# Patient Record
Sex: Female | Born: 1940 | Race: White | Hispanic: No | Marital: Married | Smoking: Never smoker
Health system: Southern US, Community
[De-identification: ages and names within clinical notes are randomized; demographics above are authoritative.]

## PROBLEM LIST (undated history)

## (undated) DIAGNOSIS — Z8619 Personal history of other infectious and parasitic diseases: Secondary | ICD-10-CM

## (undated) DIAGNOSIS — T7840XA Allergy, unspecified, initial encounter: Secondary | ICD-10-CM

## (undated) DIAGNOSIS — R42 Dizziness and giddiness: Secondary | ICD-10-CM

## (undated) DIAGNOSIS — G971 Other reaction to spinal and lumbar puncture: Secondary | ICD-10-CM

## (undated) DIAGNOSIS — N819 Female genital prolapse, unspecified: Secondary | ICD-10-CM

## (undated) DIAGNOSIS — I499 Cardiac arrhythmia, unspecified: Secondary | ICD-10-CM

## (undated) DIAGNOSIS — Q630 Accessory kidney: Secondary | ICD-10-CM

## (undated) DIAGNOSIS — I10 Essential (primary) hypertension: Secondary | ICD-10-CM

## (undated) DIAGNOSIS — S42201A Unspecified fracture of upper end of right humerus, initial encounter for closed fracture: Secondary | ICD-10-CM

## (undated) DIAGNOSIS — M1990 Unspecified osteoarthritis, unspecified site: Secondary | ICD-10-CM

## (undated) DIAGNOSIS — J302 Other seasonal allergic rhinitis: Secondary | ICD-10-CM

## (undated) DIAGNOSIS — E785 Hyperlipidemia, unspecified: Secondary | ICD-10-CM

## (undated) DIAGNOSIS — K219 Gastro-esophageal reflux disease without esophagitis: Secondary | ICD-10-CM

## (undated) DIAGNOSIS — L570 Actinic keratosis: Secondary | ICD-10-CM

## (undated) HISTORY — PX: VEIN SURGERY: SHX48

## (undated) HISTORY — PX: COLONOSCOPY: SHX174

## (undated) HISTORY — PX: DILATION AND CURETTAGE OF UTERUS: SHX78

## (undated) HISTORY — PX: CATARACT EXTRACTION: SUR2

## (undated) HISTORY — DX: Actinic keratosis: L57.0

---

## 2004-05-09 ENCOUNTER — Ambulatory Visit: Payer: Self-pay | Admitting: Obstetrics and Gynecology

## 2005-04-03 ENCOUNTER — Ambulatory Visit: Payer: Self-pay | Admitting: Gastroenterology

## 2005-05-15 ENCOUNTER — Ambulatory Visit: Payer: Self-pay | Admitting: Obstetrics and Gynecology

## 2006-03-12 ENCOUNTER — Ambulatory Visit: Payer: Self-pay | Admitting: Family Medicine

## 2006-05-17 ENCOUNTER — Ambulatory Visit: Payer: Self-pay | Admitting: Obstetrics and Gynecology

## 2007-05-20 ENCOUNTER — Ambulatory Visit: Payer: Self-pay | Admitting: Obstetrics and Gynecology

## 2008-01-11 ENCOUNTER — Ambulatory Visit: Payer: Self-pay | Admitting: Family Medicine

## 2008-05-25 ENCOUNTER — Ambulatory Visit: Payer: Self-pay | Admitting: Obstetrics and Gynecology

## 2008-06-07 ENCOUNTER — Ambulatory Visit: Payer: Self-pay | Admitting: Gastroenterology

## 2009-06-11 ENCOUNTER — Ambulatory Visit: Payer: Self-pay | Admitting: Obstetrics and Gynecology

## 2009-08-01 ENCOUNTER — Ambulatory Visit: Payer: Self-pay | Admitting: Orthopedic Surgery

## 2010-07-08 ENCOUNTER — Ambulatory Visit: Payer: Self-pay | Admitting: Obstetrics and Gynecology

## 2011-07-10 ENCOUNTER — Ambulatory Visit: Payer: Self-pay | Admitting: Obstetrics and Gynecology

## 2012-07-08 ENCOUNTER — Ambulatory Visit: Payer: Self-pay | Admitting: Gastroenterology

## 2012-07-11 LAB — PATHOLOGY REPORT

## 2012-07-14 ENCOUNTER — Ambulatory Visit: Payer: Self-pay | Admitting: Obstetrics and Gynecology

## 2012-07-31 ENCOUNTER — Emergency Department: Payer: Self-pay | Admitting: Emergency Medicine

## 2012-07-31 DIAGNOSIS — S42201A Unspecified fracture of upper end of right humerus, initial encounter for closed fracture: Secondary | ICD-10-CM

## 2012-07-31 HISTORY — DX: Unspecified fracture of upper end of right humerus, initial encounter for closed fracture: S42.201A

## 2013-07-17 ENCOUNTER — Ambulatory Visit: Payer: Self-pay | Admitting: Obstetrics and Gynecology

## 2013-09-18 DIAGNOSIS — N814 Uterovaginal prolapse, unspecified: Secondary | ICD-10-CM | POA: Insufficient documentation

## 2013-09-18 DIAGNOSIS — N3941 Urge incontinence: Secondary | ICD-10-CM | POA: Insufficient documentation

## 2013-09-18 DIAGNOSIS — R3129 Other microscopic hematuria: Secondary | ICD-10-CM | POA: Insufficient documentation

## 2013-09-18 DIAGNOSIS — Q630 Accessory kidney: Secondary | ICD-10-CM | POA: Insufficient documentation

## 2014-07-09 DIAGNOSIS — N819 Female genital prolapse, unspecified: Secondary | ICD-10-CM | POA: Insufficient documentation

## 2014-07-10 DIAGNOSIS — N281 Cyst of kidney, acquired: Secondary | ICD-10-CM | POA: Insufficient documentation

## 2014-07-18 ENCOUNTER — Ambulatory Visit: Payer: Self-pay | Admitting: Obstetrics and Gynecology

## 2014-07-25 ENCOUNTER — Ambulatory Visit: Payer: Self-pay | Admitting: Ophthalmology

## 2014-07-25 DIAGNOSIS — I499 Cardiac arrhythmia, unspecified: Secondary | ICD-10-CM

## 2014-07-30 ENCOUNTER — Ambulatory Visit: Payer: Self-pay | Admitting: Ophthalmology

## 2014-11-06 DIAGNOSIS — N812 Incomplete uterovaginal prolapse: Secondary | ICD-10-CM | POA: Insufficient documentation

## 2014-11-06 DIAGNOSIS — Z4689 Encounter for fitting and adjustment of other specified devices: Secondary | ICD-10-CM | POA: Insufficient documentation

## 2014-11-06 DIAGNOSIS — N8111 Cystocele, midline: Secondary | ICD-10-CM | POA: Insufficient documentation

## 2014-11-18 NOTE — Op Note (Signed)
PATIENT NAME:  [name], Terri MR#:  [id number] DATE OF BIRTH:  [birth year]  DATE OF PROCEDURE:  07/30/2014  PREOPERATIVE DIAGNOSIS:  Nuclear sclerotic cataract, left eye.    POSTOPERATIVE DIAGNOSIS:  Nuclear sclerotic cataract, left eye.    PROCEDURE PERFORMED:  Extracapsular cataract extraction using phacoemulsification with placement of an Alcon [id number], 17.5-diopter posterior chamber lens, serial #[id number].   SURGEON:  Loura Back. Gardy Montanari, MD  ASSISTANT:  None.  ANESTHESIA:  4% lidocaine and 0.75% Marcaine in a 50/50 mixture with 10 units/mL of Hylenex added, given as a peribulbar.   ANESTHESIOLOGIST:  Dr. Andree Elk.  COMPLICATIONS:  None.  ESTIMATED BLOOD LOSS:  Less than 1 ml.  DESCRIPTION OF PROCEDURE:  The patient was brought to the operating room and given a peribulbar block.  The patient was then prepped and draped in the usual fashion.  The vertical rectus muscles were imbricated using 5-0 silk sutures.  These sutures were then clamped to the sterile drapes as bridle sutures.  A limbal peritomy was performed extending two clock hours and hemostasis was obtained with cautery.  A partial thickness scleral groove was made at the surgical limbus and dissected anteriorly in a lamellar dissection using an Alcon crescent knife.  The anterior chamber was entered supero-temporally with a Superblade and through the lamellar dissection with a 2.6 mm keratome.  DisCoVisc was used to replace the aqueous and a continuous tear capsulorrhexis was carried out.  Hydrodissection and hydrodelineation were carried out with balanced salt and a 27 gauge canula.  The nucleus was rotated to confirm the effectiveness of the hydrodissection.  Phacoemulsification was carried out using a divide-and-conquer technique.  Total ultrasound time was 1 minute and 1 second with an average power of 21.7 percent and CDE of 24.36.  Irrigation/aspiration was used to remove the residual cortex.  DisCoVisc was used to  inflate the capsule and the internal incision was enlarged to 3 mm with the crescent knife.  The intraocular lens was folded and inserted into the capsular bag using the AcrySert delivery system. Irrigation/aspiration was used to remove the residual DisCoVisc.  Miostat was injected into the anterior chamber through the paracentesis track to inflate the anterior chamber and induce miosis. A tenth of a milliliter of cefuroxime containing 1 mg of drug was injected via the paracentesis tract. The wound was checked for leaks and none were found. The conjunctiva was closed with cautery and the bridle sutures were removed.  Two drops of 0.3% Vigamox were placed on the eye.   An eye shield was placed on the eye.  The patient was discharged to the recovery room in good condition.  ____________________________ Loura Back Amillia Biffle, MD sad:sb D: 07/30/2014 13:28:05 ET T: 07/30/2014 14:16:57 ET JOB#: [id number]  cc: Remo Lipps A. Calin Fantroy, MD, <Dictator> Martie Lee MD ELECTRONICALLY SIGNED 08/01/2014 16:21

## 2015-02-26 DIAGNOSIS — R200 Anesthesia of skin: Secondary | ICD-10-CM | POA: Insufficient documentation

## 2015-06-04 ENCOUNTER — Other Ambulatory Visit: Payer: Self-pay | Admitting: Obstetrics and Gynecology

## 2015-06-04 DIAGNOSIS — Z1231 Encounter for screening mammogram for malignant neoplasm of breast: Secondary | ICD-10-CM

## 2015-07-23 ENCOUNTER — Ambulatory Visit
Admission: RE | Admit: 2015-07-23 | Discharge: 2015-07-23 | Disposition: A | Discharge status: Home / Self Care | Payer: Medicare Other | Source: Ambulatory Visit | Attending: Obstetrics and Gynecology | Admitting: Obstetrics and Gynecology

## 2015-07-23 ENCOUNTER — Other Ambulatory Visit: Payer: Self-pay | Admitting: Obstetrics and Gynecology

## 2015-07-23 DIAGNOSIS — Z1231 Encounter for screening mammogram for malignant neoplasm of breast: Secondary | ICD-10-CM | POA: Diagnosis not present

## 2016-06-08 ENCOUNTER — Other Ambulatory Visit: Payer: Self-pay | Admitting: Obstetrics and Gynecology

## 2016-06-08 DIAGNOSIS — Z1231 Encounter for screening mammogram for malignant neoplasm of breast: Secondary | ICD-10-CM

## 2016-07-29 ENCOUNTER — Ambulatory Visit
Admission: RE | Admit: 2016-07-29 | Discharge: 2016-07-29 | Disposition: A | Discharge status: Home / Self Care | Payer: Medicare Other | Source: Ambulatory Visit | Attending: Obstetrics and Gynecology | Admitting: Obstetrics and Gynecology

## 2016-07-29 ENCOUNTER — Other Ambulatory Visit: Payer: Self-pay | Admitting: Obstetrics and Gynecology

## 2016-07-29 DIAGNOSIS — Z1231 Encounter for screening mammogram for malignant neoplasm of breast: Secondary | ICD-10-CM | POA: Diagnosis not present

## 2017-06-24 ENCOUNTER — Other Ambulatory Visit: Payer: Self-pay | Admitting: Obstetrics and Gynecology

## 2017-06-24 DIAGNOSIS — Z1231 Encounter for screening mammogram for malignant neoplasm of breast: Secondary | ICD-10-CM

## 2017-08-11 ENCOUNTER — Ambulatory Visit
Admission: RE | Admit: 2017-08-11 | Discharge: 2017-08-11 | Disposition: A | Discharge status: Home / Self Care | Payer: Medicare HMO | Source: Ambulatory Visit | Attending: Obstetrics and Gynecology | Admitting: Obstetrics and Gynecology

## 2017-08-11 DIAGNOSIS — Z1231 Encounter for screening mammogram for malignant neoplasm of breast: Secondary | ICD-10-CM | POA: Insufficient documentation

## 2017-09-16 ENCOUNTER — Encounter: Payer: Self-pay | Admitting: *Deleted

## 2017-09-17 ENCOUNTER — Ambulatory Visit
Admission: RE | Admit: 2017-09-17 | Discharge: 2017-09-17 | Disposition: A | Discharge status: Home / Self Care | Payer: Medicare HMO | Source: Ambulatory Visit | Attending: Gastroenterology | Admitting: Gastroenterology

## 2017-09-17 ENCOUNTER — Encounter: Payer: Self-pay | Admitting: *Deleted

## 2017-09-17 ENCOUNTER — Encounter
Admission: RE | Disposition: A | Discharge status: Home / Self Care | Payer: Self-pay | Source: Ambulatory Visit | Attending: Gastroenterology

## 2017-09-17 ENCOUNTER — Ambulatory Visit: Payer: Medicare HMO | Admitting: Anesthesiology

## 2017-09-17 DIAGNOSIS — Z8619 Personal history of other infectious and parasitic diseases: Secondary | ICD-10-CM | POA: Insufficient documentation

## 2017-09-17 DIAGNOSIS — D123 Benign neoplasm of transverse colon: Secondary | ICD-10-CM | POA: Insufficient documentation

## 2017-09-17 DIAGNOSIS — Z800 Family history of malignant neoplasm of digestive organs: Secondary | ICD-10-CM | POA: Diagnosis not present

## 2017-09-17 DIAGNOSIS — Z7982 Long term (current) use of aspirin: Secondary | ICD-10-CM | POA: Insufficient documentation

## 2017-09-17 DIAGNOSIS — K5730 Diverticulosis of large intestine without perforation or abscess without bleeding: Secondary | ICD-10-CM | POA: Insufficient documentation

## 2017-09-17 DIAGNOSIS — Z882 Allergy status to sulfonamides status: Secondary | ICD-10-CM | POA: Diagnosis not present

## 2017-09-17 DIAGNOSIS — E785 Hyperlipidemia, unspecified: Secondary | ICD-10-CM | POA: Insufficient documentation

## 2017-09-17 DIAGNOSIS — Z86010 Personal history of colonic polyps: Secondary | ICD-10-CM | POA: Diagnosis not present

## 2017-09-17 DIAGNOSIS — Z79899 Other long term (current) drug therapy: Secondary | ICD-10-CM | POA: Diagnosis not present

## 2017-09-17 DIAGNOSIS — D122 Benign neoplasm of ascending colon: Secondary | ICD-10-CM | POA: Insufficient documentation

## 2017-09-17 DIAGNOSIS — M1990 Unspecified osteoarthritis, unspecified site: Secondary | ICD-10-CM | POA: Insufficient documentation

## 2017-09-17 DIAGNOSIS — D120 Benign neoplasm of cecum: Secondary | ICD-10-CM | POA: Diagnosis not present

## 2017-09-17 HISTORY — DX: Accessory kidney: Q63.0

## 2017-09-17 HISTORY — DX: Personal history of other infectious and parasitic diseases: Z86.19

## 2017-09-17 HISTORY — DX: Hyperlipidemia, unspecified: E78.5

## 2017-09-17 HISTORY — PX: COLONOSCOPY WITH PROPOFOL: SHX5780

## 2017-09-17 HISTORY — DX: Female genital prolapse, unspecified: N81.9

## 2017-09-17 HISTORY — DX: Allergy, unspecified, initial encounter: T78.40XA

## 2017-09-17 HISTORY — DX: Unspecified fracture of upper end of right humerus, initial encounter for closed fracture: S42.201A

## 2017-09-17 HISTORY — DX: Gastro-esophageal reflux disease without esophagitis: K21.9

## 2017-09-17 HISTORY — DX: Other seasonal allergic rhinitis: J30.2

## 2017-09-17 HISTORY — DX: Unspecified osteoarthritis, unspecified site: M19.90

## 2017-09-17 SURGERY — COLONOSCOPY WITH PROPOFOL
Anesthesia: General

## 2017-09-17 MED ORDER — SODIUM CHLORIDE 0.9 % IV SOLN
INTRAVENOUS | Status: DC
  Administered 2017-09-17: 14:00:00 via INTRAVENOUS

## 2017-09-17 MED ORDER — MIDAZOLAM HCL 2 MG/2ML IJ SOLN
INTRAMUSCULAR | Status: DC | PRN
  Administered 2017-09-17: .5 mg via INTRAVENOUS

## 2017-09-17 MED ORDER — FENTANYL CITRATE (PF) 100 MCG/2ML IJ SOLN
INTRAMUSCULAR | Status: DC | PRN
  Administered 2017-09-17: 25 ug via INTRAVENOUS

## 2017-09-17 MED ORDER — FENTANYL CITRATE (PF) 100 MCG/2ML IJ SOLN
INTRAMUSCULAR | Status: AC
  Filled 2017-09-17: qty 2

## 2017-09-17 MED ORDER — PROPOFOL 500 MG/50ML IV EMUL
INTRAVENOUS | Status: DC | PRN
  Administered 2017-09-17: 140 ug/kg/min via INTRAVENOUS

## 2017-09-17 MED ORDER — PROPOFOL 10 MG/ML IV BOLUS
INTRAVENOUS | Status: DC | PRN
  Administered 2017-09-17: 40 mg via INTRAVENOUS

## 2017-09-17 MED ORDER — MIDAZOLAM HCL 2 MG/2ML IJ SOLN
INTRAMUSCULAR | Status: AC
  Filled 2017-09-17: qty 2

## 2017-09-17 MED ORDER — PROPOFOL 500 MG/50ML IV EMUL
INTRAVENOUS | Status: AC
  Filled 2017-09-17: qty 50

## 2017-09-17 MED ORDER — EPHEDRINE SULFATE 50 MG/ML IJ SOLN
INTRAMUSCULAR | Status: DC | PRN
  Administered 2017-09-17: 15 mg via INTRAVENOUS

## 2017-09-17 MED ORDER — LIDOCAINE HCL (PF) 2 % IJ SOLN
INTRAMUSCULAR | Status: AC
  Filled 2017-09-17: qty 10

## 2017-09-17 NOTE — Transfer of Care (Signed)
Immediate Anesthesia Transfer of Care Note  Patient: Terri [name]  Procedure(s) Performed: COLONOSCOPY WITH PROPOFOL (N/A )  Patient Location: PACU  Anesthesia Type:General  Level of Consciousness: awake  Airway & Oxygen Therapy: Patient Spontanous Breathing and Patient connected to nasal cannula oxygen  Post-op Assessment: Report given to RN and Post -op Vital signs reviewed and stable  Post vital signs: Reviewed and stable  Last Vitals:  Vitals:   09/17/17 1317 09/17/17 1447  BP: (!) 156/67 (!) 111/50  Pulse: 77   Resp: 16   Temp: (!) 36.3 C (!) 36.1 C  SpO2: 100%     Last Pain:  Vitals:   09/17/17 1447  TempSrc: Tympanic         Complications: No apparent anesthesia complications

## 2017-09-17 NOTE — H&P (Signed)
Outpatient short stay form Pre-procedure 09/17/2017 1:43 PM Lollie Sails MD  Primary Physician: Dr. [place] Pink  Reason for visit: Colonoscopy  History of present illness: Patient is a 77 year old female presenting today as above.  She has personal history of adenomatous colon polyps.  There is a family history of colon cancer primary relative, daughter.  She tolerated prep well.  She does take a 81 mg aspirin that is been held for a couple of days.  She takes no other aspirin products or blood thinning agents.    Current Facility-Administered Medications:  .  0.9 %  sodium chloride infusion, , Intravenous, Continuous, Lollie Sails, MD, Last Rate: 20 mL/hr at 09/17/17 1338 .  0.9 %  sodium chloride infusion, , Intravenous, Continuous, Lollie Sails, MD  Medications Prior to Admission  Medication Sig Dispense Refill Last Dose  . aspirin EC 81 MG tablet Take 81 mg by mouth daily.   Past Week at Unknown time  . ergocalciferol (VITAMIN D2) 50000 units capsule Take 50,000 Units by mouth once a week.   Past Week at Unknown time  . meloxicam (MOBIC) 7.5 MG tablet Take 7.5 mg by mouth daily as needed for pain.   Past Month at Unknown time  . simvastatin (ZOCOR) 20 MG tablet Take 20 mg by mouth daily.   Past Week at Unknown time  . triamcinolone cream (KENALOG) 0.1 % Apply 1 application topically 2 (two) times daily as needed.     . vitamin B-12 (CYANOCOBALAMIN) 1000 MCG tablet Take 1,000 mcg by mouth daily.   Past Week at Unknown time  . estradiol (ESTRACE) 0.1 MG/GM vaginal cream Place 1 Applicatorful vaginally 2 (two) times a week.   Not Taking at Unknown time     Allergies  Allergen Reactions  . Sulfa Antibiotics      Past Medical History:  Diagnosis Date  . Allergic state   . Arthritis   . Closed fracture of right proximal humerus 07/31/2012  . GERD (gastroesophageal reflux disease)   . History of shingles   . Hyperlipidemia   . Prolapse of female pelvic organs    . Renal duplication   . Seasonal allergies     Review of systems:      Physical Exam    Heart and lungs: Regular rate and rhythm without rub or gallop, lungs are bilaterally clear.    HEENT: Normocephalic atraumatic eyes are anicteric    Other:    Pertinant exam for procedure: Soft nontender nondistended bowel sounds positive normoactive    Planned proceedures: Colonoscopy and indicated procedures. I have discussed the risks benefits and complications of procedures to include not limited to bleeding, infection, perforation and the risk of sedation and the patient wishes to proceed.    Lollie Sails, MD Gastroenterology 09/17/2017  1:43 PM

## 2017-09-17 NOTE — Op Note (Signed)
[facility] Gastroenterology Patient Name: Terri [name] Procedure Date: 09/17/2017 1:42 PM MRN: [id number] Account #: [id number] Date of Birth: 1941-06-12 Admit Type: Outpatient Age: 77 Room: [facility] ENDO ROOM 3 Gender: Female Note Status: Finalized Procedure:            Colonoscopy Indications:          Family history of colon cancer in a first-degree                        relative, Personal history of colonic polyps Providers:            Lollie Sails, MD Referring MD:         No Local Md, MD (Referring MD) Medicines:            Monitored Anesthesia Care Complications:        No immediate complications. Procedure:            Pre-Anesthesia Assessment:                       - ASA Grade Assessment: II - A patient with mild                        systemic disease.                       After obtaining informed consent, the colonoscope was                        passed under direct vision. Throughout the procedure,                        the patient's blood pressure, pulse, and oxygen                        saturations were monitored continuously. The                        Colonoscope was introduced through the anus and                        advanced to the the cecum, identified by appendiceal                        orifice and ileocecal valve. The colonoscopy was                        unusually difficult due to a tortuous colon. Successful                        completion of the procedure was aided by changing the                        patient to a supine position, changing the patient to a                        prone position, using manual pressure and withdrawing                        and reinserting the scope. The quality of the bowel  preparation was good. Findings:      A 2 mm polyp was found in the cecum. The polyp was sessile. The polyp       was removed with a cold biopsy forceps. Resection and retrieval were   complete.      A 2 mm polyp was found in the proximal ascending colon. The polyp was       sessile. The polyp was removed with a cold biopsy forceps. Resection and       retrieval were complete.      A 8 mm polyp was found in the hepatic flexure. The polyp was sessile.       The polyp was removed with a piecemeal technique using a cold biopsy       forceps. Resection and retrieval were complete.      Multiple small-mouthed diverticula were found in the sigmoid colon and       descending colon.      The digital rectal exam was normal. Impression:           - One 2 mm polyp in the cecum, removed with a cold                        biopsy forceps. Resected and retrieved.                       - One 2 mm polyp in the proximal ascending colon,                        removed with a cold biopsy forceps. Resected and                        retrieved.                       - One 8 mm polyp at the hepatic flexure, removed                        piecemeal using a cold biopsy forceps. Resected and                        retrieved.                       - Diverticulosis in the sigmoid colon and in the                        descending colon. Recommendation:       - Discharge patient to home.                       - Await pathology results.                       - Telephone GI clinic for pathology results in 1 week. Procedure Code(s):    --- Professional ---                       [phone], Colonoscopy, flexible; with biopsy, single or                        multiple Diagnosis Code(s):    --- Professional ---  D12.0, Benign neoplasm of cecum                       D12.2, Benign neoplasm of ascending colon                       D12.3, Benign neoplasm of transverse colon (hepatic                        flexure or splenic flexure)                       Z80.0, Family history of malignant neoplasm of                        digestive organs                       Z86.010, Personal history  of colonic polyps                       K57.30, Diverticulosis of large intestine without                        perforation or abscess without bleeding CPT copyright 2016 [employer]. All rights reserved. The codes documented in this report are preliminary and upon coder review may  be revised to meet current compliance requirements. Lollie Sails, MD 09/17/2017 2:45:07 PM This report has been signed electronically. Number of Addenda: 0 Note Initiated On: 09/17/2017 1:42 PM Scope Withdrawal Time: 0 hours 15 minutes 38 seconds  Total Procedure Duration: 0 hours 48 minutes 45 seconds       [facility]

## 2017-09-17 NOTE — Anesthesia Procedure Notes (Signed)
Date/Time: 09/17/2017 1:45 PM Performed by: Allean Found, CRNA Pre-anesthesia Checklist: Patient identified, Emergency Drugs available, Suction available, Patient being monitored and Timeout performed Patient Re-evaluated:Patient Re-evaluated prior to induction Oxygen Delivery Method: Nasal cannula Placement Confirmation: positive ETCO2

## 2017-09-17 NOTE — Anesthesia Post-op Follow-up Note (Signed)
Anesthesia QCDR form completed.        

## 2017-09-18 ENCOUNTER — Encounter: Payer: Self-pay | Admitting: Gastroenterology

## 2017-09-20 NOTE — Anesthesia Postprocedure Evaluation (Signed)
Anesthesia Post Note  Patient: Terri [name]  Procedure(s) Performed: COLONOSCOPY WITH PROPOFOL (N/A )  Patient location during evaluation: PACU Anesthesia Type: General Level of consciousness: awake and alert Pain management: pain level controlled Vital Signs Assessment: post-procedure vital signs reviewed and stable Respiratory status: spontaneous breathing, nonlabored ventilation, respiratory function stable and patient connected to nasal cannula oxygen Cardiovascular status: blood pressure returned to baseline and stable Postop Assessment: no apparent nausea or vomiting Anesthetic complications: no     Last Vitals:  Vitals:   09/17/17 1447 09/17/17 1507  BP: (!) 111/50 (!) 125/56  Pulse:    Resp:    Temp: (!) 36.1 C   SpO2:      Last Pain:  Vitals:   09/17/17 1447  TempSrc: Tympanic                 Molli Barrows

## 2017-09-20 NOTE — Anesthesia Preprocedure Evaluation (Signed)
Anesthesia Evaluation  Patient identified by MRN, date of birth, ID band Patient awake    Reviewed: Allergy & Precautions, H&P , NPO status , Patient's Chart, lab work & pertinent test results, reviewed documented beta blocker date and time   Airway Mallampati: II   Neck ROM: full    Dental  (+) Poor Dentition   Pulmonary neg pulmonary ROS,    Pulmonary exam normal        Cardiovascular Exercise Tolerance: Good negative cardio ROS Normal cardiovascular exam Rhythm:regular Rate:Normal     Neuro/Psych negative neurological ROS  negative psych ROS   GI/Hepatic negative GI ROS, Neg liver ROS, GERD  Medicated,  Endo/Other  negative endocrine ROS  Renal/GU Renal diseasenegative Renal ROS  negative genitourinary   Musculoskeletal   Abdominal   Peds  Hematology negative hematology ROS (+)   Anesthesia Other Findings Past Medical History: No date: Allergic state No date: Arthritis 07/31/2012: Closed fracture of right proximal humerus No date: GERD (gastroesophageal reflux disease) No date: History of shingles No date: Hyperlipidemia 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 No date: Seasonal allergies Past Surgical History: No date: CATARACT EXTRACTION No date: COLONOSCOPY 09/17/2017: COLONOSCOPY WITH PROPOFOL; N/A     Comment:  Procedure: COLONOSCOPY WITH PROPOFOL;  Surgeon:               Lollie Sails, MD;  Location: [facility] ENDOSCOPY;                Service: Endoscopy;  Laterality: N/A; No date: DILATION AND CURETTAGE OF UTERUS No date: VEIN SURGERY BMI    Body Mass Index:  22.14 kg/m     Reproductive/Obstetrics negative OB ROS                             Anesthesia Physical Anesthesia Plan  ASA: II  Anesthesia Plan: General   Post-op Pain Management:    Induction:   PONV Risk Score and Plan:   Airway Management Planned:   Additional  Equipment:   Intra-op Plan:   Post-operative Plan:   Informed Consent: I have reviewed the patients History and Physical, chart, labs and discussed the procedure including the risks, benefits and alternatives for the proposed anesthesia with the patient or authorized representative who has indicated his/her understanding and acceptance.   Dental Advisory Given  Plan Discussed with: CRNA  Anesthesia Plan Comments:         Anesthesia Quick Evaluation

## 2017-09-21 LAB — SURGICAL PATHOLOGY

## 2018-06-28 ENCOUNTER — Other Ambulatory Visit: Payer: Self-pay | Admitting: Obstetrics and Gynecology

## 2018-06-28 DIAGNOSIS — Z1231 Encounter for screening mammogram for malignant neoplasm of breast: Secondary | ICD-10-CM

## 2018-08-12 ENCOUNTER — Ambulatory Visit
Admission: RE | Admit: 2018-08-12 | Discharge: 2018-08-12 | Disposition: A | Discharge status: Home / Self Care | Payer: Medicare HMO | Source: Ambulatory Visit | Attending: Obstetrics and Gynecology | Admitting: Obstetrics and Gynecology

## 2018-08-12 DIAGNOSIS — Z1231 Encounter for screening mammogram for malignant neoplasm of breast: Secondary | ICD-10-CM | POA: Diagnosis not present

## 2018-12-14 ENCOUNTER — Emergency Department: Payer: Medicare HMO

## 2018-12-14 ENCOUNTER — Other Ambulatory Visit: Payer: Self-pay

## 2018-12-14 ENCOUNTER — Emergency Department
Admission: EM | Admit: 2018-12-14 | Discharge: 2018-12-14 | Disposition: A | Discharge status: Home / Self Care | Payer: Medicare HMO | Attending: Emergency Medicine | Admitting: Emergency Medicine

## 2018-12-14 DIAGNOSIS — R42 Dizziness and giddiness: Secondary | ICD-10-CM | POA: Diagnosis present

## 2018-12-14 DIAGNOSIS — H81392 Other peripheral vertigo, left ear: Secondary | ICD-10-CM | POA: Insufficient documentation

## 2018-12-14 DIAGNOSIS — I10 Essential (primary) hypertension: Secondary | ICD-10-CM | POA: Diagnosis not present

## 2018-12-14 DIAGNOSIS — Z79899 Other long term (current) drug therapy: Secondary | ICD-10-CM | POA: Diagnosis not present

## 2018-12-14 DIAGNOSIS — R110 Nausea: Secondary | ICD-10-CM | POA: Insufficient documentation

## 2018-12-14 DIAGNOSIS — Z7982 Long term (current) use of aspirin: Secondary | ICD-10-CM | POA: Insufficient documentation

## 2018-12-14 DIAGNOSIS — R51 Headache: Secondary | ICD-10-CM | POA: Diagnosis not present

## 2018-12-14 HISTORY — DX: Essential (primary) hypertension: I10

## 2018-12-14 LAB — BASIC METABOLIC PANEL
Anion gap: 8 (ref 5–15)
BUN: 14 mg/dL (ref 8–23)
CO2: 26 mmol/L (ref 22–32)
Calcium: 8.7 mg/dL — ABNORMAL LOW (ref 8.9–10.3)
Chloride: 105 mmol/L (ref 98–111)
Creatinine, Ser: 0.58 mg/dL (ref 0.44–1.00)
GFR calc Af Amer: >60 mL/min (ref >60)
GFR calc non Af Amer: >60 mL/min (ref >60)
Glucose, Bld: 141 mg/dL — ABNORMAL HIGH (ref 70–99)
Potassium: 3.8 mmol/L (ref 3.5–5.1)
Sodium: 139 mmol/L (ref 135–145)

## 2018-12-14 LAB — URINALYSIS, COMPLETE (UACMP) WITH MICROSCOPIC
Bacteria, UA: NONE SEEN
Bilirubin Urine: NEGATIVE
Glucose, UA: NEGATIVE mg/dL
Hgb urine dipstick: NEGATIVE
Ketones, ur: NEGATIVE mg/dL
Nitrite: NEGATIVE
Protein, ur: NEGATIVE mg/dL
Specific Gravity, Urine: 1.008 (ref 1.005–1.030)
pH: 8 (ref 5.0–8.0)

## 2018-12-14 LAB — ETHANOL: Alcohol, Ethyl (B): <10 mg/dL (ref <10)

## 2018-12-14 LAB — CBC
HCT: 35.8 % — ABNORMAL LOW (ref 36.0–46.0)
Hemoglobin: 12 g/dL (ref 12.0–15.0)
MCH: 30.6 pg (ref 26.0–34.0)
MCHC: 33.5 g/dL (ref 30.0–36.0)
MCV: 91.3 fL (ref 80.0–100.0)
Platelets: 259 10*3/uL (ref 150–400)
RBC: 3.92 MIL/uL (ref 3.87–5.11)
RDW: 12.5 % (ref 11.5–15.5)
WBC: 5.1 10*3/uL (ref 4.0–10.5)
nRBC: 0 % (ref 0.0–0.2)

## 2018-12-14 LAB — ACETAMINOPHEN LEVEL: Acetaminophen (Tylenol), Serum: <10 ug/mL — ABNORMAL LOW (ref 10–30)

## 2018-12-14 LAB — TROPONIN I: Troponin I: <0.03 ng/mL (ref <0.03)

## 2018-12-14 LAB — SALICYLATE LEVEL: Salicylate Lvl: <7 mg/dL (ref 2.8–30.0)

## 2018-12-14 MED ORDER — ONDANSETRON HCL 4 MG/2ML IJ SOLN
4.0000 mg | Freq: Once | INTRAMUSCULAR | Status: DC
  Filled 2018-12-14: qty 2

## 2018-12-14 MED ORDER — SODIUM CHLORIDE 0.9% FLUSH: 3.0000 mL | Freq: Once | INTRAVENOUS | Status: DC

## 2018-12-14 MED ORDER — ONDANSETRON 4 MG PO TBDP
4.0000 mg | ORAL_TABLET | Freq: Once | ORAL | Status: AC
  Administered 2018-12-14: 14:00:00 4 mg via ORAL

## 2018-12-14 MED ORDER — SCOPOLAMINE 1 MG/3DAYS TD PT72
1.0000 | MEDICATED_PATCH | TRANSDERMAL | Status: DC
  Administered 2018-12-14: 1.5 mg via TRANSDERMAL
  Filled 2018-12-14: qty 1

## 2018-12-14 MED ORDER — ONDANSETRON 4 MG PO TBDP
ORAL_TABLET | ORAL | Status: AC
  Administered 2018-12-14: 4 mg via ORAL
  Filled 2018-12-14: qty 1

## 2018-12-14 MED ORDER — MECLIZINE HCL 25 MG PO TABS
25.0000 mg | ORAL_TABLET | Freq: Once | ORAL | Status: AC
  Administered 2018-12-14: 25 mg via ORAL
  Filled 2018-12-14: qty 1

## 2018-12-14 MED ORDER — ONDANSETRON 4 MG PO TBDP: 4.0000 mg | ORAL_TABLET | Freq: Three times a day (TID) | ORAL | 0 refills | Status: DC | PRN

## 2018-12-14 MED ORDER — SODIUM CHLORIDE 0.9 % IV BOLUS
1000.0000 mL | Freq: Once | INTRAVENOUS | Status: AC
  Administered 2018-12-14: 1000 mL via INTRAVENOUS

## 2018-12-14 MED ORDER — MECLIZINE HCL 25 MG PO TABS: 25.0000 mg | ORAL_TABLET | Freq: Three times a day (TID) | ORAL | 1 refills | Status: DC | PRN

## 2018-12-14 NOTE — ED Notes (Signed)
Walked to bsc with one assist, moved slowly, was a little dizzy however she states the meclizine has helped. Alert and oriented, no new complaints. Call light in reach.

## 2018-12-14 NOTE — ED Notes (Signed)
CBG not collected upon arrival. 141 with blood work from lab.

## 2018-12-14 NOTE — Discharge Instructions (Addendum)
Your lab tests, CT scan of the head, and MRI of the brain were all okay today.   We placed a scopolamine patch behind your ear in the ED to help with the symptoms.  This can take a few hours to kick in.  You should remove the patch as soon as you are feeling better, because it can cause side effects including confusion, flushing, dry mouth, and inability to urinate.

## 2018-12-14 NOTE — ED Provider Notes (Signed)
[facility] Emergency Department Provider Note  ____________________________________________  Time seen: Approximately 10:02 AM  I have reviewed the triage vital signs and the nursing notes.   HISTORY  Chief Complaint Dizziness    HPI Terri [name] is a 78 y.o. female with a history of hypertension hyperlipidemia and GERD who complains of dizziness first noticed on waking up this morning  at 6:00 AM.  She reports being normal at bedtime last night.  Dizziness is described as room swaying.  Associated with nausea and a "funny feeling in my head" that she says is not painful and is not a headache.  Denies any pain anywhere.  No chest pain shortness of breath palpitations or syncope.  Dizziness is worse with change in position and turning her head.  She had a few episodes of this over the past 2 weeks that she did not seek care for and resolve spontaneously within an hour or so.  Today's episode feels more severe and is longer lasting so she comes for evaluation.  She denies any paresthesias or motor weakness.  No vision changes.  No recent illness or sick contacts.  Eating and drinking normally.  She reports that when she stands up she feels unsteady on her feet.  Past Medical History:  Diagnosis Date  . Allergic state   . Arthritis   . Closed fracture of right proximal humerus 07/31/2012  . GERD (gastroesophageal reflux disease)   . History of shingles   . Hyperlipidemia   . Hypertension   . Prolapse of female pelvic organs   . Renal duplication   . Seasonal allergies      There are no active problems to display for this patient.    Past Surgical History:  Procedure Laterality Date  . CATARACT EXTRACTION    . COLONOSCOPY    . COLONOSCOPY WITH PROPOFOL N/A 09/17/2017   Procedure: COLONOSCOPY WITH PROPOFOL;  Surgeon: Lollie Sails, MD;  Location: [facility] ENDOSCOPY;  Service: Endoscopy;  Laterality: N/A;  . DILATION AND CURETTAGE OF UTERUS    . VEIN  SURGERY       Prior to Admission medications   Medication Sig Start Date End Date Taking? Authorizing Provider  amLODipine (NORVASC) 5 MG tablet Take 5 mg by mouth every evening.  09/29/18  Yes [provider]  aspirin EC 81 MG tablet Take 81 mg by mouth every evening.    Yes [provider]  ergocalciferol (VITAMIN D2) 50000 units capsule Take 50,000 Units by mouth every Sunday.    Yes [provider]  meloxicam (MOBIC) 7.5 MG tablet Take 7.5 mg by mouth daily as needed for pain.   Yes [provider]  simvastatin (ZOCOR) 20 MG tablet Take 20 mg by mouth every evening.    Yes [provider]  triamcinolone cream (KENALOG) 0.1 % Apply 1 application topically 2 (two) times daily as needed.   Yes [provider]  vitamin B-12 (CYANOCOBALAMIN) 1000 MCG tablet Take 1,000 mcg by mouth every evening.    Yes [provider]  meclizine (ANTIVERT) 25 MG tablet Take 1 tablet (25 mg total) by mouth 3 (three) times daily as needed for dizziness or nausea. 12/14/18   Carrie Mew, MD  ondansetron (ZOFRAN ODT) 4 MG disintegrating tablet Take 1 tablet (4 mg total) by mouth every 8 (eight) hours as needed for nausea or vomiting. 12/14/18   Carrie Mew, MD     Allergies Sulfa antibiotics   Family History  Problem Relation  Age of Onset  . Pancreatic cancer Mother   . Breast cancer Mother   . Hypertension Father   . Diabetes Father   . Prostate cancer Father   . Hypertension Sister   . Crohn's disease Sister   . Hypertension Brother   . Crohn's disease Brother     Social History Social History   Tobacco Use  . Smoking status: Never Smoker  . Smokeless tobacco: Never Used  Substance Use Topics  . Alcohol use: No    Frequency: Never  . Drug use: No    Review of Systems  Constitutional:   No fever or chills.  ENT:   No sore throat. No rhinorrhea. Cardiovascular:   No chest pain or syncope. Respiratory:   No dyspnea  or cough. Gastrointestinal:   Negative for abdominal pain, vomiting and diarrhea.  Musculoskeletal:   Negative for focal pain or swelling All other systems reviewed and are negative except as documented above in ROS and HPI.  ____________________________________________   PHYSICAL EXAM:  VITAL SIGNS: ED Triage Vitals  Enc Vitals Group     BP 12/14/18 0706 (!) 133/57     Pulse Rate 12/14/18 0706 80     Resp 12/14/18 0706 16     Temp 12/14/18 0706 97.6 F (36.4 C)     Temp Source 12/14/18 0706 Oral     SpO2 12/14/18 0706 99 %     Weight 12/14/18 0707 125 lb (56.7 kg)     Height 12/14/18 0707 5\' 5"  (1.651 m)     Head Circumference --      Peak Flow --      Pain Score 12/14/18 0706 9     Pain Loc --      Pain Edu? --      Excl. in [place]? --     Vital signs reviewed, nursing assessments reviewed.   Constitutional:   Alert and oriented. Non-toxic appearance. Eyes:   Conjunctivae are normal. EOMI. PERRL.  No nystagmus ENT      Head:   Normocephalic and atraumatic.      Nose:   No congestion/rhinnorhea.       Mouth/Throat:   MMM, no pharyngeal erythema. No peritonsillar mass.       Neck:   No meningismus. Full ROM. Hematological/Lymphatic/Immunilogical:   No cervical lymphadenopathy. Cardiovascular:   RRR. Symmetric bilateral radial and DP pulses.  No murmurs. Cap refill less than 2 seconds. Respiratory:   Normal respiratory effort without tachypnea/retractions. Breath sounds are clear and equal bilaterally. No wheezes/rales/rhonchi. Gastrointestinal:   Soft and nontender. Non distended. There is no CVA tenderness.  No rebound, rigidity, or guarding.  Musculoskeletal:   Normal range of motion in all extremities. No joint effusions.  No lower extremity tenderness.  No edema. Neurologic:   Normal speech and language.  Cranial nerves III through XII intact Motor grossly intact. No pronator drift, normal finger-to-nose.  Cerebellar function intact No acute focal neurologic  deficits are appreciated.  Skin:    Skin is warm, dry and intact. No rash noted.  No petechiae, purpura, or bullae.  ____________________________________________    LABS (pertinent positives/negatives) (all labs ordered are listed, but only abnormal results are displayed) Labs Reviewed  BASIC METABOLIC PANEL - Abnormal; Notable for the following components:      Result Value   Glucose, Bld 141 (*)    Calcium 8.7 (*)    All other components within normal limits  CBC - Abnormal; Notable for the following components:  HCT 35.8 (*)    All other components within normal limits  URINALYSIS, COMPLETE (UACMP) WITH MICROSCOPIC - Abnormal; Notable for the following components:   Color, Urine YELLOW (*)    APPearance CLOUDY (*)    Leukocytes,Ua TRACE (*)    All other components within normal limits  ACETAMINOPHEN LEVEL - Abnormal; Notable for the following components:   Acetaminophen (Tylenol), Serum <10 (*)    All other components within normal limits  URINE CULTURE  TROPONIN I  SALICYLATE LEVEL  ETHANOL  CBG MONITORING, ED   ____________________________________________   EKG  Interpreted by me Normal sinus rhythm rate of 76, normal axis and intervals.  Normal QRS ST segments and T waves.  ____________________________________________    RADIOLOGY  Ct Head Wo Contrast  Result Date: 12/14/2018 CLINICAL DATA:  Headache, dizziness. EXAM: CT HEAD WITHOUT CONTRAST TECHNIQUE: Contiguous axial images were obtained from the base of the skull through the vertex without intravenous contrast. COMPARISON:  None. FINDINGS: Brain: Mild chronic ischemic white matter disease is noted. No mass effect or midline shift is noted. Ventricular size is within normal limits. There is no evidence of mass lesion, hemorrhage or acute infarction. Vascular: No hyperdense vessel or unexpected calcification. Skull: Normal. Negative for fracture or focal lesion. Sinuses/Orbits: No acute finding. Other: None.  IMPRESSION: Mild chronic ischemic white matter disease. No acute intracranial abnormality seen. Electronically Signed   By: Marijo Conception M.D.   On: 12/14/2018 08:16   Mr Brain Wo Contrast  Result Date: 12/14/2018 CLINICAL DATA:  Awoke today with dizziness, nausea and headache. EXAM: MRI HEAD WITHOUT CONTRAST TECHNIQUE: Multiplanar, multiecho pulse sequences of the brain and surrounding structures were obtained without intravenous contrast. COMPARISON:  Head CT same day FINDINGS: Brain: Diffusion imaging does not show any acute or subacute infarction. There chronic small-vessel changes of the pons. No focal cerebellar finding. Cerebral hemispheres show moderate chronic small-vessel ischemic changes of the deep and subcortical white matter. No cortical or large vessel territory infarction. No mass lesion, hemorrhage, hydrocephalus or extra-axial collection. Vascular: Major vessels at the base of the brain show flow. Skull and upper cervical spine: Negative Sinuses/Orbits: Clear/normal Other: None IMPRESSION: No acute finding by MRI. Moderate chronic small-vessel ischemic changes of the cerebral hemispheric white matter and mild changes of the pons. Electronically Signed   By: Nelson Chimes M.D.   On: 12/14/2018 11:15    ____________________________________________   PROCEDURES Procedures  ____________________________________________  DIFFERENTIAL DIAGNOSIS   Peripheral vertigo, acute ischemic stroke, intracranial hemorrhage, electrolyte abnormality, UTI, dehydration  CLINICAL IMPRESSION / ASSESSMENT AND PLAN / ED COURSE  Medications ordered in the ED: Medications  sodium chloride flush (NS) 0.9 % injection 3 mL (has no administration in time range)  ondansetron (ZOFRAN) injection 4 mg (has no administration in time range)  scopolamine (TRANSDERM-SCOP) 1 MG/3DAYS 1.5 mg (has no administration in time range)  sodium chloride 0.9 % bolus 1,000 mL (0 mLs Intravenous Stopped 12/14/18 1008)   meclizine (ANTIVERT) tablet 25 mg (25 mg Oral Given 12/14/18 0818)    Pertinent labs & imaging results that were available during my care of the patient were reviewed by me and considered in my medical decision making (see chart for details).  Terri [name] was evaluated in Emergency Department on 12/14/2018 for the symptoms described in the history of present illness. She was evaluated in the context of the global COVID-19 pandemic, which necessitated consideration that the patient might be at risk for infection with the SARS-CoV-2 virus that  causes COVID-19. Institutional protocols and algorithms that pertain to the evaluation of patients at risk for COVID-19 are in a state of rapid change based on information released by regulatory bodies including the CDC and federal and state organizations. These policies and algorithms were followed during the patient's care in the ED.   Patient presents with episode of dizziness that is most likely peripheral, however with the stuttering onset over the past few weeks and being more pronounced today, this warrants evaluation with CT head, follow-up MRI brain if the CT is negative.  Check labs as well.  I will treat vertigo with meclizine and IV fluids in the meantime.  If work-up is reassuring and symptoms improved to the point the patient can walk safely and function independently, she may be suitable for outpatient follow-up. Clinical Course as of Dec 14 1246  Wed Dec 14, 2018  1242 Patient reports she is feeling about the same, not significantly improved.  Turning her head to the left to look at me she notes that it made her symptoms suddenly worsen again.  Work-up was entirely unremarkable.  CT head and MRI brain are negative.  Symptoms are consistent with peripheral vertigo.  Offered hospitalization due to her ongoing dizziness and increased risk of fall and injury, but she declines and feels comfortable going home with her family.  I will give Zofran for  nausea, scopolamine patch here in the ED with the instruction to remove it as soon as she is feeling better.  Warned her about the anticholinergic side effects.  Recommend follow-up with primary care as well as ENT (she has seen Dr. Pryor Ochoa in the past she says).   [PS]    Clinical Course User Index [PS] Carrie Mew, MD     ____________________________________________   FINAL CLINICAL IMPRESSION(S) / ED DIAGNOSES    Final diagnoses:  Peripheral vertigo involving left ear     ED Discharge Orders         Ordered    meclizine (ANTIVERT) 25 MG tablet  3 times daily PRN     12/14/18 1243    ondansetron (ZOFRAN ODT) 4 MG disintegrating tablet  Every 8 hours PRN     12/14/18 1244          Portions of this note were generated with dragon dictation software. Dictation errors may occur despite best attempts at proofreading.   Carrie Mew, MD 12/14/18 1248

## 2018-12-14 NOTE — ED Notes (Signed)
Pt taken to CT.

## 2018-12-14 NOTE — ED Triage Notes (Addendum)
Pt c/o waking with dizziness, nausea, HA and diaphoresis since 6am this morning.

## 2018-12-14 NOTE — ED Notes (Signed)
Pt resting in bed, dr Joni Fears at bedside, NAD. VSS. Connected to cardiac monitor, alert and oriented, call bell in reach, bed locked and low.

## 2018-12-14 NOTE — ED Notes (Signed)
Pt resting in bed, talking on phone with MRI technician, no new complaints, VSS. Alert and oriented.

## 2018-12-15 LAB — URINE CULTURE

## 2019-07-11 ENCOUNTER — Other Ambulatory Visit: Payer: Self-pay | Admitting: Obstetrics and Gynecology

## 2019-07-11 DIAGNOSIS — Z1231 Encounter for screening mammogram for malignant neoplasm of breast: Secondary | ICD-10-CM

## 2019-08-14 ENCOUNTER — Ambulatory Visit
Admission: RE | Admit: 2019-08-14 | Discharge: 2019-08-14 | Disposition: A | Discharge status: Home / Self Care | Payer: Medicare HMO | Source: Ambulatory Visit | Attending: Obstetrics and Gynecology | Admitting: Obstetrics and Gynecology

## 2019-08-14 DIAGNOSIS — Z1231 Encounter for screening mammogram for malignant neoplasm of breast: Secondary | ICD-10-CM

## 2020-06-03 ENCOUNTER — Other Ambulatory Visit: Payer: Self-pay

## 2020-06-03 ENCOUNTER — Encounter: Payer: Self-pay | Admitting: Dermatology

## 2020-06-03 ENCOUNTER — Ambulatory Visit: Payer: Medicare HMO | Admitting: Dermatology

## 2020-06-03 DIAGNOSIS — L570 Actinic keratosis: Secondary | ICD-10-CM

## 2020-06-03 DIAGNOSIS — I781 Nevus, non-neoplastic: Secondary | ICD-10-CM

## 2020-06-03 DIAGNOSIS — L814 Other melanin hyperpigmentation: Secondary | ICD-10-CM

## 2020-06-03 DIAGNOSIS — L219 Seborrheic dermatitis, unspecified: Secondary | ICD-10-CM

## 2020-06-03 DIAGNOSIS — D229 Melanocytic nevi, unspecified: Secondary | ICD-10-CM

## 2020-06-03 DIAGNOSIS — D692 Other nonthrombocytopenic purpura: Secondary | ICD-10-CM

## 2020-06-03 DIAGNOSIS — L821 Other seborrheic keratosis: Secondary | ICD-10-CM | POA: Diagnosis not present

## 2020-06-03 DIAGNOSIS — L578 Other skin changes due to chronic exposure to nonionizing radiation: Secondary | ICD-10-CM

## 2020-06-03 DIAGNOSIS — W57XXXA Bitten or stung by nonvenomous insect and other nonvenomous arthropods, initial encounter: Secondary | ICD-10-CM

## 2020-06-03 DIAGNOSIS — Z1283 Encounter for screening for malignant neoplasm of skin: Secondary | ICD-10-CM | POA: Diagnosis not present

## 2020-06-03 DIAGNOSIS — L719 Rosacea, unspecified: Secondary | ICD-10-CM | POA: Diagnosis not present

## 2020-06-03 DIAGNOSIS — L853 Xerosis cutis: Secondary | ICD-10-CM

## 2020-06-03 DIAGNOSIS — D1800 Hemangioma unspecified site: Secondary | ICD-10-CM

## 2020-06-03 MED ORDER — MOMETASONE FUROATE 0.1 % EX SOLN: CUTANEOUS | 2 refills | Status: DC

## 2020-06-03 MED ORDER — TRIAMCINOLONE ACETONIDE 0.1 % EX CREA: 1.0000 "application " | TOPICAL_CREAM | CUTANEOUS | 1 refills | Status: DC

## 2020-06-03 NOTE — Progress Notes (Signed)
Follow-Up Visit   Subjective  Terri [name] is a 79 y.o. female who presents for the following: Annual Exam (Total body skin exam, hx Aks).  She has several spots to check, and an itchy rash on the back of her scalp. She has had similar scalp rash in past. She also needs a rf of the TMC cream that she uses for bug bites in the summer.   The following portions of the chart were reviewed this encounter and updated as appropriate:      Review of Systems:  No other skin or systemic complaints except as noted in HPI or Assessment and Plan.  Objective  Well appearing patient in no apparent distress; mood and affect are within normal limits.  A full examination was performed including scalp, head, eyes, ears, nose, lips, neck, chest, axillae, abdomen, back, buttocks, bilateral upper extremities, bilateral lower extremities, hands, feet, fingers, toes, fingernails, and toenails. All findings within normal limits unless otherwise noted below.  Objective  L lat eyebrow x 1, R malar cheek x 1, L cheek x 2, L sternal notch x 1 (5): Pink scaly macules   Objective  malar cheeks: Mild erythema and telangiectasias  Objective  L infra occular: 3.24mm stuck on waxy pap L infra occular  Objective  Occipital scalp, hairline: Pink patches with greasy scale.   Objective  trunk, arms: Clear today  Objective  bil legs: Telangiectasias legs   Assessment & Plan    Actinic Damage - chronic, secondary to cumulative UV radiation exposure/sun exposure over time - diffuse scaly erythematous macules with underlying dyspigmentation - Recommend daily broad spectrum sunscreen SPF 30+ to sun-exposed areas, reapply every 2 hours as needed.  - Call for new or changing lesions.  Skin cancer screening performed today.  AK (actinic keratosis) (5) L lat eyebrow x 1, R malar cheek x 1, L cheek x 2, L sternal notch x 1  Destruction of lesion - L lat eyebrow x 1, R malar cheek x 1, L cheek x 2, L  sternal notch x 1  Destruction method: cryotherapy   Informed consent: discussed and consent obtained   Lesion destroyed using liquid nitrogen: Yes   Region frozen until ice ball extended beyond lesion: Yes   Outcome: patient tolerated procedure well with no complications   Post-procedure details: wound care instructions given    Rosacea malar cheeks  Mild, discussed starting topical cream but pt declines treatment since not bothersome  Recommend daily broad spectrum sunscreen SPF 30+ to face  Rosacea is a chronic progressive skin condition usually affecting the face of adults. It is treatable but not curable. It sometimes affects the eyes (ocular rosacea) as well. It may respond to topical and/or systemic medication and can flare with stress, sun exposure, alcohol, exercise and some foods.   Seborrheic keratosis L infra occular  Benign, observation  Discussed cosmetic procedure cryotherapy, noncovered.  $60 for 1st lesion and $15 for each additional lesion if done on the same day.  Maximum charge $350.  One touch-up treatment included no charge. Discussed risks of treatment including dyspigmentation, small scar, and/or recurrence.   Seborrheic dermatitis Occipital scalp, hairline  Chronic condtion, with flare Start Elocon lotion qd/bid prn flares Recommend Head and Shoulders shampoo or the scalp mist to localized areas    mometasone (ELOCON) 0.1 % lotion - Occipital scalp, hairline  Bug bite without infection, initial encounter trunk, arms  Clear today,  Cont TMC 0.1% cr qd/bid prn bites, avoid f/g/a  triamcinolone  cream (KENALOG) 0.1 % - trunk, arms  Spider veins bil legs  Benign, observe  Seborrheic Keratoses - Stuck-on, waxy, tan-brown papules and plaques  - Discussed benign etiology and prognosis. - Observe - Call for any changes  Lentigines - Scattered tan macules - Discussed due to sun exposure - Benign, observe - Call for any  changes  Hemangiomas - Red papules - Discussed benign nature - Observe - Call for any changes  Purpura - Violaceous macules and patches - Benign - Related to age, sun damage and/or use of blood thinners - Observe - Can use OTC arnica containing moisturizer such as Dermend Bruise Formula if desired - Call for worsening or other concerns  Xerosis - diffuse xerotic patches - recommend gentle, hydrating skin care - gentle skin care handout given  Melanocytic Nevi - Tan-brown and/or pink-flesh-colored symmetric macules and papules - Benign appearing on exam today - Observation - Call clinic for new or changing moles - Recommend daily use of broad spectrum spf 30+ sunscreen to sun-exposed areas.    Return in about 1 year (around 06/03/2021) for TBSE, hx AKs.  I, Othelia Pulling, RMA, am acting as scribe for Brendolyn Patty, MD . Documentation: I have reviewed the above documentation for accuracy and completeness, and I agree with the above.  Brendolyn Patty MD

## 2020-07-17 ENCOUNTER — Other Ambulatory Visit: Payer: Self-pay | Admitting: Obstetrics and Gynecology

## 2020-07-17 DIAGNOSIS — Z1231 Encounter for screening mammogram for malignant neoplasm of breast: Secondary | ICD-10-CM

## 2020-08-15 ENCOUNTER — Other Ambulatory Visit: Payer: Self-pay

## 2020-08-15 ENCOUNTER — Ambulatory Visit
Admission: RE | Admit: 2020-08-15 | Discharge: 2020-08-15 | Disposition: A | Discharge status: Home / Self Care | Payer: Medicare HMO | Source: Ambulatory Visit | Attending: Obstetrics and Gynecology | Admitting: Obstetrics and Gynecology

## 2020-08-15 DIAGNOSIS — Z1231 Encounter for screening mammogram for malignant neoplasm of breast: Secondary | ICD-10-CM | POA: Diagnosis not present

## 2020-12-26 ENCOUNTER — Other Ambulatory Visit: Payer: Self-pay | Admitting: Dermatology

## 2021-01-08 ENCOUNTER — Other Ambulatory Visit: Payer: Self-pay

## 2021-01-08 ENCOUNTER — Encounter: Payer: Self-pay | Admitting: Dermatology

## 2021-01-08 ENCOUNTER — Ambulatory Visit: Payer: Medicare HMO | Admitting: Dermatology

## 2021-01-08 DIAGNOSIS — R21 Rash and other nonspecific skin eruption: Secondary | ICD-10-CM | POA: Diagnosis not present

## 2021-01-08 MED ORDER — CLOBETASOL PROP EMOLLIENT BASE 0.05 % EX CREA: TOPICAL_CREAM | CUTANEOUS | 0 refills | Status: DC

## 2021-01-08 MED ORDER — HYDROCORTISONE 2.5 % EX CREA
TOPICAL_CREAM | CUTANEOUS | 0 refills | Status: DC
Start: 2021-01-08 — End: 2021-02-07

## 2021-01-08 NOTE — Patient Instructions (Addendum)
Start clobetasol 0.05% cream twice daily for up to 2 weeks as needed for rash. Avoid applying to face, groin, and axilla. Use as directed. Risk of skin atrophy with long-term use reviewed.   Start HC 2.5% cream twice daily for up to 1 week to affected areas at face as needed for rash.   Topical steroids (such as triamcinolone, fluocinolone, fluocinonide, mometasone, clobetasol, halobetasol, betamethasone, hydrocortisone) can cause thinning and lightening of the skin if they are used for too long in the same area. Your physician has selected the right strength medicine for your problem and area affected on the body. Please use your medication only as directed by your physician to prevent side effects.   If you have any questions or concerns for your doctor, please call our main line at [phone] and press option 4 to reach your doctor's medical assistant. If no one answers, please leave a voicemail as directed and we will return your call as soon as possible. Messages left after 4 pm will be answered the following business day.   You may also send [place] a message via [facility]. We typically respond to MyChart messages within 1-2 business days.  For prescription refills, please ask your pharmacy to contact our office. Our fax number is [phone].  If you have an urgent issue when the clinic is closed that cannot wait until the next business day, you can page your doctor at the number below.    Please note that while we do our best to be available for urgent issues outside of office hours, we are not available 24/7.   If you have an urgent issue and are unable to reach [place], you may choose to seek medical care at your doctor's office, retail clinic, urgent care center, or emergency room.  If you have a medical emergency, please immediately call 911 or go to the emergency department.  Pager Numbers  - Dr. Nehemiah Massed: [phone]  - Dr. Laurence Ferrari: [phone]  - Dr. Nicole Kindred: [phone]  In the  event of inclement weather, please call our main line at [phone] for an update on the status of any delays or closures.  Dermatology Medication Tips: Please keep the boxes that topical medications come in in order to help keep track of the instructions about where and how to use these. Pharmacies typically print the medication instructions only on the boxes and not directly on the medication tubes.   If your medication is too expensive, please contact our office at [phone] option 4 or send [place] a message through [place].   We are unable to tell what your co-pay for medications will be in advance as this is different depending on your insurance coverage. However, we may be able to find a substitute medication at lower cost or fill out paperwork to get insurance to cover a needed medication.   If a prior authorization is required to get your medication covered by your insurance company, please allow [place] 1-2 business days to complete this process.  Drug prices often vary depending on where the prescription is filled and some pharmacies may offer cheaper prices.  The website www.goodrx.com contains coupons for medications through different pharmacies. The prices here do not account for what the cost may be with help from insurance (it may be cheaper with your insurance), but the website can give you the price if you did not use any insurance.  - You can print the associated coupon and take it with your prescription to the pharmacy.  -  You may also stop by our office during regular business hours and pick up a GoodRx coupon card.  - If you need your prescription sent electronically to a different pharmacy, notify our office through [facility] or by phone at [phone] option 4.

## 2021-01-08 NOTE — Progress Notes (Signed)
   Follow-Up Visit   Subjective  Terri [name] is a 80 y.o. female who presents for the following: Rash (Patient with rash at trunk and arms, spreading down to legs. Patient had poison ivy about 2 1/2 weeks ago and this rash started about 1 week ago. No new medications, patient tried to change laundry soap but no improvement. Patient has some TMC she used, Benadryl and [place] powder but she is still itching. ).  She also tried taking antihistamine but not regularly.   The following portions of the chart were reviewed this encounter and updated as appropriate:   Tobacco  Allergies  Meds  Problems  Med Hx  Surg Hx  Fam Hx       Review of Systems:  No other skin or systemic complaints except as noted in HPI or Assessment and Plan.  Objective  Well appearing patient in no apparent distress; mood and affect are within normal limits.  A focused examination was performed including back, abdomen, arms, legs. Relevant physical exam findings are noted in the Assessment and Plan.  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 She has a history of not feeling well on prednisone.  Start clobetasol 0.05% cream twice daily for up to 2 weeks as needed for rash. Avoid applying to face, groin, and axilla. Use as directed. Risk of skin atrophy with long-term use reviewed.   Start HC 2.5% cream twice daily for up to 1 week to affected areas at face as needed for rash.   Topical steroids (such as triamcinolone, fluocinolone, fluocinonide, mometasone, clobetasol, halobetasol, betamethasone, hydrocortisone) can cause thinning and lightening of the skin if they are used for too long in the same area. Your physician has selected the right strength medicine for your problem and area  affected on the body. Please use your medication only as directed by your physician to prevent side effects.    hydrocortisone 2.5 % cream - trunk, arms Apply twice daily to rash at face for up to 1 week as needed for rash.  Return for TBSE, as scheduled.  Call if rash not improving by next week.  Graciella Belton, RMA, am acting as scribe for Forest Gleason, MD .  Documentation: I have reviewed the above documentation for accuracy and completeness, and I agree with the above.  Forest Gleason, MD

## 2021-01-12 NOTE — Discharge Instructions (Signed)
Instructions after Total Knee Replacement   Terri [name], Jr., M.D.     Dept. of Orthopaedics & Sports Medicine  [facility]  [address]  [place], [place]  [address]  Phone: [phone]   Fax: [phone]    DIET: Drink plenty of non-alcoholic fluids. Resume your normal diet. Include foods high in fiber.  ACTIVITY:  You may use crutches or a walker with weight-bearing as tolerated, unless instructed otherwise. You may be weaned off of the walker or crutches by your Physical Therapist.  Do NOT place pillows under the knee. Anything placed under the knee could limit your ability to straighten the knee.   Continue doing gentle exercises. Exercising will reduce the pain and swelling, increase motion, and prevent muscle weakness.   Please continue to use the TED compression stockings for 6 weeks. You may remove the stockings at night, but should reapply them in the morning. Do not drive or operate any equipment until instructed.  WOUND CARE:  Continue to use the PolarCare or ice packs periodically to reduce pain and swelling. You may bathe or shower after the staples are removed at the first office visit following surgery.  MEDICATIONS: You may resume your regular medications. Please take the pain medication as prescribed on the medication. Do not take pain medication on an empty stomach. You have been given a prescription for a blood thinner (Lovenox or Coumadin). Please take the medication as instructed. (NOTE: After completing a 2 week course of Lovenox, take one Enteric-coated aspirin once a day. This along with elevation will help reduce the possibility of phlebitis in your operated leg.) Do not drive or drink alcoholic beverages when taking pain medications.  CALL THE OFFICE FOR: Temperature above 101 degrees Excessive bleeding or drainage on the dressing. Excessive swelling, coldness, or paleness of the toes. Persistent nausea and vomiting.  FOLLOW-UP:  You  should have an appointment to return to the office in 10-14 days after surgery. Arrangements have been made for continuation of Physical Therapy (either home therapy or outpatient therapy).   [facility] Department Directory         [email or web address]       [email or web address]          Cardiology  Appointments: [place] - [phone] [place] - [phone]  Endocrinology  Appointments: [place] - [phone] [place] - [phone]  Gastroenterology  Appointments: [place] - [phone] [place] - [phone]        General Surgery   Appointments: [place] - [phone]  Internal Medicine/Family Medicine  Appointments: [place] - [phone] [place] - [phone] [place] - [phone]  Metabolic and Weigh Loss Surgery  Appointments: [place] - [phone]        Neurology  Appointments: [place] - [phone] [place] - [phone]  Neurosurgery  Appointments: [place] - [phone]  Obstetrics & Gynecology  Appointments: [place] - [phone] [place] - [phone]        Pediatrics  Appointments: [place] - [phone] [place] - [phone]  Physiatry  Appointments: [facility] -[phone]  Physical Therapy  Appointments: [place] - [phone] [place] - [phone]        Podiatry  Appointments: [place] - [phone] [place] - [phone]  Pulmonology  Appointments: [place] - [phone]  Rheumatology  Appointments: [place] - [phone]        [place] Location: [facility]  [address] , [place]  [address]  [place] Location: [facility] [address] [place], [place]  [address]  [place] Location: [facility] [address] [place], [place]  [address]    

## 2021-01-24 ENCOUNTER — Other Ambulatory Visit: Payer: Self-pay

## 2021-01-24 ENCOUNTER — Other Ambulatory Visit
Admission: RE | Admit: 2021-01-24 | Discharge: 2021-01-24 | Disposition: A | Discharge status: Home / Self Care | Payer: Medicare HMO | Source: Ambulatory Visit | Attending: Orthopedic Surgery | Admitting: Orthopedic Surgery

## 2021-01-24 DIAGNOSIS — Z01818 Encounter for other preprocedural examination: Secondary | ICD-10-CM | POA: Insufficient documentation

## 2021-01-24 DIAGNOSIS — Z01810 Encounter for preprocedural cardiovascular examination: Secondary | ICD-10-CM

## 2021-01-24 HISTORY — DX: Dizziness and giddiness: R42

## 2021-01-24 HISTORY — DX: Cardiac arrhythmia, unspecified: I49.9

## 2021-01-24 LAB — COMPREHENSIVE METABOLIC PANEL
ALT: 16 U/L (ref 0–44)
AST: 25 U/L (ref 15–41)
Albumin: 3.8 g/dL (ref 3.5–5.0)
Alkaline Phosphatase: 47 U/L (ref 38–126)
Anion gap: 7 (ref 5–15)
BUN: 17 mg/dL (ref 8–23)
CO2: 28 mmol/L (ref 22–32)
Calcium: 9 mg/dL (ref 8.9–10.3)
Chloride: 105 mmol/L (ref 98–111)
Creatinine, Ser: 0.56 mg/dL (ref 0.44–1.00)
GFR, Estimated: >60 mL/min (ref >60)
Glucose, Bld: 96 mg/dL (ref 70–99)
Potassium: 3.6 mmol/L (ref 3.5–5.1)
Sodium: 140 mmol/L (ref 135–145)
Total Bilirubin: 0.9 mg/dL (ref 0.3–1.2)
Total Protein: 6.9 g/dL (ref 6.5–8.1)

## 2021-01-24 LAB — CBC
HCT: 38.3 % (ref 36.0–46.0)
Hemoglobin: 12.7 g/dL (ref 12.0–15.0)
MCH: 30.5 pg (ref 26.0–34.0)
MCHC: 33.2 g/dL (ref 30.0–36.0)
MCV: 91.8 fL (ref 80.0–100.0)
Platelets: 268 10*3/uL (ref 150–400)
RBC: 4.17 MIL/uL (ref 3.87–5.11)
RDW: 12.7 % (ref 11.5–15.5)
WBC: 5 10*3/uL (ref 4.0–10.5)
nRBC: 0 % (ref 0.0–0.2)

## 2021-01-24 LAB — SURGICAL PCR SCREEN
MRSA, PCR: NEGATIVE
Staphylococcus aureus: NEGATIVE

## 2021-01-24 LAB — URINALYSIS, ROUTINE W REFLEX MICROSCOPIC
Bacteria, UA: NONE SEEN
Bilirubin Urine: NEGATIVE
Glucose, UA: NEGATIVE mg/dL
Ketones, ur: NEGATIVE mg/dL
Nitrite: NEGATIVE
Protein, ur: NEGATIVE mg/dL
Specific Gravity, Urine: 1.004 — ABNORMAL LOW (ref 1.005–1.030)
pH: 6 (ref 5.0–8.0)

## 2021-01-24 LAB — C-REACTIVE PROTEIN: CRP: 0.6 mg/dL (ref <1.0)

## 2021-01-24 LAB — SEDIMENTATION RATE: Sed Rate: 14 mm/hr (ref 0–30)

## 2021-01-24 NOTE — Patient Instructions (Addendum)
Your procedure is scheduled on: Friday February 07, 2021. Report to Day Surgery inside [facility] 2nd floor (stop by admissions desk first before getting on elevator). To find out your arrival time please call [phone] between 1PM - 3PM on Thursday February 06, 2021.  Remember: Instructions that are not followed completely may result in serious medical risk,  up to and including death, or upon the discretion of your surgeon and anesthesiologist your  surgery may need to be rescheduled.     _X__ 1. Do not eat food after midnight the night before your procedure.                 No chewing gum or hard candies. You may drink clear liquids up to 2 hours                 before you are scheduled to arrive for your surgery- DO not drink clear                 liquids within 2 hours of the start of your surgery.                 Clear Liquids include:  water, apple juice without pulp, clear Gatorade, G2 or                  Gatorade Zero (avoid Red/Purple/Blue), Black Coffee or Tea (Do not add                 anything to coffee or tea).  __X__2.   Complete the "Ensure Clear Pre-surgery Clear Carbohydrate Drink" provided to you, 2 hours before arrival. **If you are diabetic you will be provided with an alternative drink, Gatorade Zero or G2.  __X__3.  On the morning of surgery brush your teeth with toothpaste and water, you                may rinse your mouth with mouthwash if you wish.  Do not swallow any toothpaste of mouthwash.     _X__ 4.  No Alcohol for 24 hours before or after surgery.   _X__ 5.  Do Not Smoke or use e-cigarettes For 24 Hours Prior to Your Surgery.                 Do not use any chewable tobacco products for at least 6 hours prior to                 Surgery.  _X__  6.  Do not use any recreational drugs (marijuana, cocaine, heroin, ecstasy, MDMA or other)                For at least one week prior to your surgery.  Combination of these drugs with  anesthesia                May have life threatening results.  __X__  7.  Notify your doctor if there is any change in your medical condition      (cold, fever, infections).     Do not wear jewelry, make-up, hairpins, clips or nail polish. Do not wear lotions, powders, or perfumes. You may wear deodorant. Do not shave 48 hours prior to surgery. Men may shave face and neck. Do not bring valuables to the hospital.    [facility] is not responsible for any belongings or valuables.  Contacts, dentures or bridgework may not be worn into surgery. Leave your suitcase in the car. After  surgery it may be brought to your room. For patients admitted to the hospital, discharge time is determined by your treatment team.   Patients discharged the day of surgery will not be allowed to drive home.   Make arrangements for someone to be with you for the first 24 hours of your Same Day Discharge.    Please read over the following fact sheets that you were given:   Total Joint Packet    __X__ Take these medicines the morning of surgery with A SIP OF WATER:    1. None   2.   3.   4.  5.  6.  ____ Fleet Enema (as directed)   __X__ Use CHG Soap (or wipes) as directed  ____ Use Benzoyl Peroxide Gel as instructed  ____ Use inhalers on the day of surgery  ____ Stop metformin 2 days prior to surgery    ____ Take 1/2 of usual insulin dose the night before surgery. No insulin the morning          of surgery.   ____ Call your PCP, cardiologist, or Pulmonologist if taking Coumadin/Plavix/aspirin and ask when to stop before your surgery.   __X__ One Week prior to surgery- Stop Anti-inflammatories such as Ibuprofen, Aleve, Advil, Motrin, meloxicam (MOBIC), diclofenac, etodolac, ketorolac, Toradol, Daypro, piroxicam, Goody's or BC powders. OK TO USE TYLENOL IF NEEDED   __X__ One week before your surgery stop supplements until after surgery.    ____ Bring C-Pap to the hospital.    If you have  any questions regarding your pre-procedure instructions,  Please call Pre-admit Testing at [phone].

## 2021-01-25 LAB — URINE CULTURE
Culture: NO GROWTH
Special Requests: NORMAL

## 2021-01-29 LAB — TYPE AND SCREEN
ABO/RH(D): B POS
Antibody Screen: NEGATIVE

## 2021-02-02 NOTE — H&P (Signed)
ORTHOPAEDIC HISTORY & PHYSICAL Gwenlyn Fudge, [place] - 01/30/2021 3:45 PM EDT Formatting of this note is different from the original. [facility] Chief Complaint:   Chief Complaint  Patient presents with   Knee Pain  H & P RIGHT KNEE   History of Present Illness:   KESTREL MIS is a 80 y.o. female that presents to clinic today for her preoperative history and evaluation. Patient presents unaccompanied. The patient is scheduled to undergo a right total knee arthroplasty on 02/07/2021 by Dr. Marry Guan. Her pain began several years ago. The pain is located primarily along the medial aspect of the knee. She describes her pain as worse with weightbearing. She reports associated swelling with some giving way of the knees. She denies associated numbness or tingling, denies locking.   The patient's symptoms have progressed to the point that they decrease her quality of life. The patient has previously undergone conservative treatment including NSAIDS, intra-articular cortisone injections, use of a knee sleeve, and activity modification to the knee without adequate control of her symptoms.  Patient denies history of blood clots, significant cardiac history, or lumbar surgery.   Patient will be staying home with her husband and her daughter.  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 Renal ultrasound in12/2015   Seasonal allergies   Vitamin D deficiency   Past Surgical History:  Past Surgical History:  Procedure Laterality Date   CATARACT EXTRACTION Left   COLONOSCOPY 06/2012  5 yrs    COLONOSCOPY 09/17/2017  Tubular adenoma of the colon/Hyperplastic colon polyp/Repeat 33yrs/MUS   DILATION AND CURETTAGE, DIAGNOSTIC / THERAPEUTIC 1990   vein surgery 1988   Current Medications:  Current Outpatient Medications  Medication Sig Dispense Refill   acetaminophen (TYLENOL) 500 MG tablet Take 1,000 mg by mouth every 6 (six) hours as needed   amLODIPine (NORVASC) 5 MG tablet Take 1 tablet (5 mg total) by mouth once daily 90 tablet 3   ascorbic acid, vitamin C, (VITAMIN C) 250 MG tablet Take 1 capsule by mouth once daily   aspirin 81 MG chewable tablet Take 81 mg by mouth once a week   calcium carbonate (TUMS) 200 mg calcium (500 mg) chewable tablet Take 2 tablets by mouth 2 (two) times daily as needed   cyanocobalamin (VITAMIN B12) 1000 MCG tablet Take 1,000 mcg by mouth once daily.   ergocalciferol, vitamin D2, 1,250 mcg (50,000 unit) capsule Take 1 capsule (50,000 Units total) by mouth once a week 12 capsule 3   hydrocortisone 2.5 % cream Apply topically 2 (two) times daily as needed   loratadine (CLARITIN) 10 mg tablet Take 10 mg by mouth once daily   meloxicam (MOBIC) 7.5 MG tablet Take 1 tablet (7.5 mg total) by mouth once daily as needed 90 tablet 3   simvastatin (ZOCOR) 20 MG tablet Take 1 tablet (20 mg total) by mouth nightly 90 tablet 3   ZINC CITRATE ORAL Take 30 mg by mouth once daily   No current facility-administered medications for this visit.   Allergies:  Allergies  Allergen Reactions   Sulfa (Sulfonamide Antibiotics) Hives  and Rash   Social History:  Social History   Socioeconomic History   Marital status: Married  Spouse name: Steffanie Dunn   Number of children: 3   Years of education: 63  Occupational History   Occupation: Retired- Therapist, sports  Tobacco Use   Smoking status: Never Smoker   Smokeless tobacco: Never Used  Substance and Sexual Activity   Alcohol use: No   Drug use: No   Sexual activity: Yes  Partners: Male   Family History:  Family History   Problem Relation Age of Onset   High blood pressure (Hypertension) Father   Diabetes Father   Diabetes type II Father   Prostate cancer Father   High blood pressure (Hypertension) Sister   Crohn's disease Sister   High blood pressure (Hypertension) Brother   Crohn's disease Brother   Pancreatic cancer Mother   Review of Systems:   A 10+ ROS was performed, reviewed, and the pertinent orthopaedic findings are documented in the HPI.   Physical Examination:   BP 132/80 (BP Location: Left upper arm, Patient Position: Sitting, BP Cuff Size: Adult)  Ht 160 cm (5\' 3" )  Wt 59.6 kg (131 lb 6.4 oz)  BMI 23.28 kg/m   Patient is a well-developed, well-nourished female in no acute distress. Patient has normal mood and affect. Patient is alert and oriented to person, place, and time.   HEENT: Atraumatic, normocephalic. Pupils equal and reactive to light. Extraocular motion intact. Noninjected sclera.  Cardiovascular: Regular rate and rhythm, with no murmurs, rubs, or gallops. Distal pulses palpable.  Respiratory: Lungs clear to auscultation bilaterally.   Right Knee: Soft tissue swelling: minimal Effusion: none Erythema: none Crepitance: mild Tenderness: medial Alignment: relative varus Mediolateral laxity: medial pseudolaxity Posterior sag: negative Patellar tracking: Good tracking without evidence of subluxation or tilt Atrophy: No significant atrophy.  Quadriceps tone was fair to good. Range of motion: 0/3/135 degrees    Sensation intact over the saphenous, lateral sural cutaneous, superficial fibular, and deep fibular nerve distributions.  Tests Performed/Reviewed:  X-rays  3 views of the right knee were obtained. Images reveal severe loss of medial compartment joint space with significant osteophyte formation. No fractures or dislocations. No osseous abnormality noted.  Impression:   ICD-10-CM  1. Primary osteoarthritis of right knee M17.11   Plan:   The patient  has end-stage degenerative changes of the right knee. It was explained to the patient that the condition is progressive in nature. Having failed conservative treatment, the patient has elected to proceed with a total joint arthroplasty. The patient will undergo a total joint arthroplasty with Dr. Marry Guan. The risks of surgery, including blood clot and infection, were discussed with the patient. Measures to reduce these risks, including the use of anticoagulation, perioperative antibiotics, and early ambulation were discussed. The importance of postoperative physical therapy was discussed with the patient. The patient elects to proceed with surgery. The patient is instructed to stop all blood thinners prior to surgery. The patient is instructed to call the hospital the day before surgery to learn of the proper arrival time.   Contact our office with any questions or concerns. Follow up as indicated, or sooner should any new problems arise, if conditions worsen, or if they are otherwise concerned.   Gwenlyn Fudge, [facility] [place], [place] [address] Phone: [phone]  This note was generated in part with voice recognition software and I apologize for any typographical errors that were not detected  and corrected.  Electronically signed by Gwenlyn Fudge, PA at 02/01/2021 1:57 PM EDT

## 2021-02-05 ENCOUNTER — Other Ambulatory Visit: Payer: Self-pay

## 2021-02-05 ENCOUNTER — Other Ambulatory Visit
Admission: RE | Admit: 2021-02-05 | Discharge: 2021-02-05 | Disposition: A | Discharge status: Home / Self Care | Payer: Medicare HMO | Source: Ambulatory Visit | Attending: Orthopedic Surgery | Admitting: Orthopedic Surgery

## 2021-02-05 DIAGNOSIS — Z01812 Encounter for preprocedural laboratory examination: Secondary | ICD-10-CM | POA: Insufficient documentation

## 2021-02-05 DIAGNOSIS — Z20822 Contact with and (suspected) exposure to covid-19: Secondary | ICD-10-CM | POA: Insufficient documentation

## 2021-02-05 LAB — SARS CORONAVIRUS 2 (TAT 6-24 HRS): SARS Coronavirus 2: NEGATIVE

## 2021-02-07 ENCOUNTER — Ambulatory Visit: Payer: Medicare HMO | Admitting: Anesthesiology

## 2021-02-07 ENCOUNTER — Observation Stay: Payer: Medicare HMO

## 2021-02-07 ENCOUNTER — Encounter
Admission: RE | Disposition: A | Discharge status: Home / Self Care | Payer: Self-pay | Source: Home / Self Care | Attending: Orthopedic Surgery

## 2021-02-07 ENCOUNTER — Observation Stay
Admission: RE | Admit: 2021-02-07 | Discharge: 2021-02-08 | Disposition: A | Discharge status: Home / Self Care | Payer: Medicare HMO | Attending: Orthopedic Surgery | Admitting: Orthopedic Surgery

## 2021-02-07 ENCOUNTER — Encounter: Payer: Self-pay | Admitting: Orthopedic Surgery

## 2021-02-07 ENCOUNTER — Other Ambulatory Visit: Payer: Self-pay

## 2021-02-07 DIAGNOSIS — I10 Essential (primary) hypertension: Secondary | ICD-10-CM | POA: Diagnosis not present

## 2021-02-07 DIAGNOSIS — N8110 Cystocele, unspecified: Secondary | ICD-10-CM | POA: Insufficient documentation

## 2021-02-07 DIAGNOSIS — Z79899 Other long term (current) drug therapy: Secondary | ICD-10-CM | POA: Insufficient documentation

## 2021-02-07 DIAGNOSIS — Z96651 Presence of right artificial knee joint: Secondary | ICD-10-CM

## 2021-02-07 DIAGNOSIS — Z7982 Long term (current) use of aspirin: Secondary | ICD-10-CM | POA: Diagnosis not present

## 2021-02-07 DIAGNOSIS — E785 Hyperlipidemia, unspecified: Secondary | ICD-10-CM | POA: Insufficient documentation

## 2021-02-07 DIAGNOSIS — M1711 Unilateral primary osteoarthritis, right knee: Principal | ICD-10-CM | POA: Insufficient documentation

## 2021-02-07 DIAGNOSIS — Z96659 Presence of unspecified artificial knee joint: Secondary | ICD-10-CM

## 2021-02-07 HISTORY — PX: KNEE ARTHROPLASTY: SHX992

## 2021-02-07 LAB — ABO/RH: ABO/RH(D): B POS

## 2021-02-07 SURGERY — ARTHROPLASTY, KNEE, TOTAL, USING IMAGELESS COMPUTER-ASSISTED NAVIGATION
Anesthesia: Spinal | Site: Knee | Laterality: Right

## 2021-02-07 MED ORDER — DEXAMETHASONE SODIUM PHOSPHATE 10 MG/ML IJ SOLN
INTRAMUSCULAR | Status: AC
  Administered 2021-02-07: 8 mg via INTRAVENOUS
  Filled 2021-02-07: qty 1

## 2021-02-07 MED ORDER — TRANEXAMIC ACID-NACL 1000-0.7 MG/100ML-% IV SOLN
INTRAVENOUS | Status: DC | PRN
  Administered 2021-02-07: 1000 mg via INTRAVENOUS

## 2021-02-07 MED ORDER — VITAMIN D (ERGOCALCIFEROL) 1.25 MG (50000 UNIT) PO CAPS: 50000.0000 [IU] | ORAL_CAPSULE | ORAL | Status: DC

## 2021-02-07 MED ORDER — DIPHENHYDRAMINE HCL 12.5 MG/5ML PO ELIX: 12.5000 mg | ORAL_SOLUTION | ORAL | Status: DC | PRN

## 2021-02-07 MED ORDER — TRAMADOL HCL 50 MG PO TABS
50.0000 mg | ORAL_TABLET | ORAL | Status: DC | PRN
  Administered 2021-02-07 – 2021-02-08 (×4): 100 mg via ORAL
  Filled 2021-02-07 (×2): qty 2
  Filled 2021-02-07: qty 1
  Filled 2021-02-07: qty 2
  Filled 2021-02-07: qty 1

## 2021-02-07 MED ORDER — METOCLOPRAMIDE HCL 10 MG PO TABS
10.0000 mg | ORAL_TABLET | Freq: Three times a day (TID) | ORAL | Status: DC
  Administered 2021-02-07 – 2021-02-08 (×2): 10 mg via ORAL
  Filled 2021-02-07 (×2): qty 1

## 2021-02-07 MED ORDER — BUPIVACAINE LIPOSOME 1.3 % IJ SUSP
INTRAMUSCULAR | Status: AC
  Filled 2021-02-07: qty 20

## 2021-02-07 MED ORDER — BUPIVACAINE HCL (PF) 0.25 % IJ SOLN
INTRAMUSCULAR | Status: DC | PRN
  Administered 2021-02-07: 60 mL

## 2021-02-07 MED ORDER — CALCIUM CARBONATE ANTACID 500 MG PO CHEW: 500.0000 mg | CHEWABLE_TABLET | Freq: Every day | ORAL | Status: DC | PRN

## 2021-02-07 MED ORDER — MOMETASONE FUROATE 0.1 % EX CREA
1.0000 "application " | TOPICAL_CREAM | Freq: Two times a day (BID) | CUTANEOUS | Status: DC | PRN
  Filled 2021-02-07: qty 15

## 2021-02-07 MED ORDER — LACTATED RINGERS IV SOLN: INTRAVENOUS | Status: DC

## 2021-02-07 MED ORDER — CHLORHEXIDINE GLUCONATE 4 % EX LIQD: 60.0000 mL | Freq: Once | CUTANEOUS | Status: DC

## 2021-02-07 MED ORDER — VITAMIN B-12 1000 MCG PO TABS
1000.0000 ug | ORAL_TABLET | Freq: Every evening | ORAL | Status: DC
  Administered 2021-02-07: 1000 ug via ORAL
  Filled 2021-02-07: qty 1

## 2021-02-07 MED ORDER — PHENYLEPHRINE HCL (PRESSORS) 10 MG/ML IV SOLN
INTRAVENOUS | Status: DC | PRN
  Administered 2021-02-07: 100 ug via INTRAVENOUS

## 2021-02-07 MED ORDER — TRANEXAMIC ACID-NACL 1000-0.7 MG/100ML-% IV SOLN: 1000.0000 mg | INTRAVENOUS | Status: DC

## 2021-02-07 MED ORDER — FENTANYL CITRATE (PF) 100 MCG/2ML IJ SOLN: 25.0000 ug | INTRAMUSCULAR | Status: DC | PRN

## 2021-02-07 MED ORDER — SIMVASTATIN 20 MG PO TABS
20.0000 mg | ORAL_TABLET | Freq: Every evening | ORAL | Status: DC
  Administered 2021-02-07 (×2): 20 mg via ORAL
  Filled 2021-02-07: qty 1

## 2021-02-07 MED ORDER — CELECOXIB 200 MG PO CAPS: 400.0000 mg | ORAL_CAPSULE | Freq: Once | ORAL | Status: DC

## 2021-02-07 MED ORDER — ACETAMINOPHEN 325 MG PO TABS: 325.0000 mg | ORAL_TABLET | Freq: Four times a day (QID) | ORAL | Status: DC | PRN

## 2021-02-07 MED ORDER — CEFAZOLIN SODIUM-DEXTROSE 2-3 GM-%(50ML) IV SOLR
INTRAVENOUS | Status: DC | PRN
  Administered 2021-02-07: 2 g via INTRAVENOUS

## 2021-02-07 MED ORDER — GABAPENTIN 300 MG PO CAPS: 300.0000 mg | ORAL_CAPSULE | Freq: Once | ORAL | Status: DC

## 2021-02-07 MED ORDER — SENNOSIDES-DOCUSATE SODIUM 8.6-50 MG PO TABS
1.0000 | ORAL_TABLET | Freq: Two times a day (BID) | ORAL | Status: DC
  Administered 2021-02-07 – 2021-02-08 (×2): 1 via ORAL
  Filled 2021-02-07 (×2): qty 1

## 2021-02-07 MED ORDER — LORATADINE 10 MG PO TABS: 10.0000 mg | ORAL_TABLET | Freq: Every day | ORAL | Status: DC | PRN

## 2021-02-07 MED ORDER — ASCORBIC ACID 500 MG PO TABS
250.0000 mg | ORAL_TABLET | Freq: Every day | ORAL | Status: DC
  Administered 2021-02-07 – 2021-02-08 (×2): 250 mg via ORAL
  Filled 2021-02-07 (×2): qty 1

## 2021-02-07 MED ORDER — TRANEXAMIC ACID-NACL 1000-0.7 MG/100ML-% IV SOLN
INTRAVENOUS | Status: AC
  Filled 2021-02-07: qty 100

## 2021-02-07 MED ORDER — ACETAMINOPHEN 10 MG/ML IV SOLN
1000.0000 mg | Freq: Four times a day (QID) | INTRAVENOUS | Status: DC
  Administered 2021-02-07 – 2021-02-08 (×3): 1000 mg via INTRAVENOUS
  Filled 2021-02-07 (×4): qty 100

## 2021-02-07 MED ORDER — FAMOTIDINE 20 MG PO TABS: 20.0000 mg | ORAL_TABLET | Freq: Once | ORAL | Status: AC

## 2021-02-07 MED ORDER — PHENYLEPHRINE HCL (PRESSORS) 10 MG/ML IV SOLN
INTRAVENOUS | Status: AC
  Filled 2021-02-07: qty 1

## 2021-02-07 MED ORDER — PHENOL 1.4 % MT LIQD
1.0000 | OROMUCOSAL | Status: DC | PRN
  Filled 2021-02-07: qty 177

## 2021-02-07 MED ORDER — CELECOXIB 200 MG PO CAPS
200.0000 mg | ORAL_CAPSULE | Freq: Two times a day (BID) | ORAL | Status: DC
  Administered 2021-02-07 – 2021-02-08 (×2): 200 mg via ORAL
  Filled 2021-02-07 (×2): qty 1

## 2021-02-07 MED ORDER — BUPIVACAINE HCL (PF) 0.25 % IJ SOLN
INTRAMUSCULAR | Status: AC
  Filled 2021-02-07: qty 60

## 2021-02-07 MED ORDER — SODIUM CHLORIDE 0.9 % IV SOLN: INTRAVENOUS | Status: DC | PRN

## 2021-02-07 MED ORDER — CEFAZOLIN SODIUM-DEXTROSE 2-4 GM/100ML-% IV SOLN: 2.0000 g | INTRAVENOUS | Status: DC

## 2021-02-07 MED ORDER — SURGIPHOR WOUND IRRIGATION SYSTEM - OPTIME
TOPICAL | Status: DC | PRN
  Administered 2021-02-07: 1

## 2021-02-07 MED ORDER — ORAL CARE MOUTH RINSE: 15.0000 mL | Freq: Once | OROMUCOSAL | Status: DC

## 2021-02-07 MED ORDER — DEXAMETHASONE SODIUM PHOSPHATE 10 MG/ML IJ SOLN: 8.0000 mg | Freq: Once | INTRAMUSCULAR | Status: DC

## 2021-02-07 MED ORDER — FAMOTIDINE 20 MG PO TABS
ORAL_TABLET | ORAL | Status: AC
  Administered 2021-02-07: 20 mg via ORAL
  Filled 2021-02-07: qty 1

## 2021-02-07 MED ORDER — FLEET ENEMA 7-19 GM/118ML RE ENEM: 1.0000 | ENEMA | Freq: Once | RECTAL | Status: DC | PRN

## 2021-02-07 MED ORDER — SODIUM CHLORIDE 0.9 % IR SOLN: Status: DC | PRN

## 2021-02-07 MED ORDER — CELECOXIB 200 MG PO CAPS
ORAL_CAPSULE | ORAL | Status: AC
  Administered 2021-02-07: 400 mg via ORAL
  Filled 2021-02-07: qty 2

## 2021-02-07 MED ORDER — FERROUS SULFATE 325 (65 FE) MG PO TABS
325.0000 mg | ORAL_TABLET | Freq: Two times a day (BID) | ORAL | Status: DC
  Administered 2021-02-07 – 2021-02-08 (×2): 325 mg via ORAL
  Filled 2021-02-07 (×2): qty 1

## 2021-02-07 MED ORDER — BUPIVACAINE HCL (PF) 0.5 % IJ SOLN
INTRAMUSCULAR | Status: DC | PRN
  Administered 2021-02-07: 2.8 mL via INTRATHECAL

## 2021-02-07 MED ORDER — SODIUM CHLORIDE 0.9 % IV SOLN: INTRAVENOUS | Status: DC

## 2021-02-07 MED ORDER — ONDANSETRON HCL 4 MG/2ML IJ SOLN
4.0000 mg | Freq: Once | INTRAMUSCULAR | Status: DC | PRN
Start: 2021-02-07 — End: 2021-02-07

## 2021-02-07 MED ORDER — LACTATED RINGERS IV SOLN: INTRAVENOUS | Status: DC | PRN

## 2021-02-07 MED ORDER — ACETAMINOPHEN 10 MG/ML IV SOLN
INTRAVENOUS | Status: DC | PRN
  Administered 2021-02-07: 1000 mg via INTRAVENOUS

## 2021-02-07 MED ORDER — ALUM & MAG HYDROXIDE-SIMETH 200-200-20 MG/5ML PO SUSP: 30.0000 mL | ORAL | Status: DC | PRN

## 2021-02-07 MED ORDER — CEFAZOLIN SODIUM-DEXTROSE 2-4 GM/100ML-% IV SOLN
2.0000 g | Freq: Four times a day (QID) | INTRAVENOUS | Status: AC
Start: 2021-02-07 — End: 2021-02-08
  Administered 2021-02-07: 2 g via INTRAVENOUS
  Filled 2021-02-07 (×2): qty 100

## 2021-02-07 MED ORDER — SODIUM CHLORIDE 0.9 % IR SOLN
Status: DC | PRN
  Administered 2021-02-07: 3000 mL

## 2021-02-07 MED ORDER — TRANEXAMIC ACID-NACL 1000-0.7 MG/100ML-% IV SOLN
1000.0000 mg | Freq: Once | INTRAVENOUS | Status: AC
  Administered 2021-02-07: 1000 mg via INTRAVENOUS

## 2021-02-07 MED ORDER — MENTHOL 3 MG MT LOZG
1.0000 | LOZENGE | OROMUCOSAL | Status: DC | PRN
  Filled 2021-02-07: qty 9

## 2021-02-07 MED ORDER — NEOMYCIN-POLYMYXIN B GU 40-200000 IR SOLN
Status: AC
  Filled 2021-02-07: qty 2

## 2021-02-07 MED ORDER — CHLORHEXIDINE GLUCONATE 0.12 % MT SOLN
OROMUCOSAL | Status: AC
  Administered 2021-02-07: 15 mL via OROMUCOSAL
  Filled 2021-02-07: qty 15

## 2021-02-07 MED ORDER — SODIUM CHLORIDE 0.9 % IV SOLN
INTRAVENOUS | Status: DC | PRN
  Administered 2021-02-07: 30 ug/min via INTRAVENOUS

## 2021-02-07 MED ORDER — OXYCODONE HCL 5 MG PO TABS
10.0000 mg | ORAL_TABLET | ORAL | Status: DC | PRN
  Administered 2021-02-07: 10 mg via ORAL
  Filled 2021-02-07: qty 2

## 2021-02-07 MED ORDER — MIDAZOLAM HCL 5 MG/5ML IJ SOLN
INTRAMUSCULAR | Status: DC | PRN
  Administered 2021-02-07: 1 mg via INTRAVENOUS

## 2021-02-07 MED ORDER — HYDROMORPHONE HCL 1 MG/ML IJ SOLN: 0.5000 mg | INTRAMUSCULAR | Status: DC | PRN

## 2021-02-07 MED ORDER — ONDANSETRON HCL 4 MG PO TABS
4.0000 mg | ORAL_TABLET | Freq: Four times a day (QID) | ORAL | Status: DC | PRN
  Administered 2021-02-07: 4 mg via ORAL
  Filled 2021-02-07: qty 1

## 2021-02-07 MED ORDER — CEFAZOLIN SODIUM-DEXTROSE 2-4 GM/100ML-% IV SOLN
INTRAVENOUS | Status: AC
  Administered 2021-02-07: 2 g via INTRAVENOUS
  Filled 2021-02-07: qty 100

## 2021-02-07 MED ORDER — ENOXAPARIN SODIUM 30 MG/0.3ML IJ SOSY
30.0000 mg | PREFILLED_SYRINGE | Freq: Two times a day (BID) | INTRAMUSCULAR | Status: DC
  Administered 2021-02-08: 30 mg via SUBCUTANEOUS
  Filled 2021-02-07: qty 0.3

## 2021-02-07 MED ORDER — PANTOPRAZOLE SODIUM 40 MG PO TBEC
40.0000 mg | DELAYED_RELEASE_TABLET | Freq: Two times a day (BID) | ORAL | Status: DC
  Administered 2021-02-07 – 2021-02-08 (×2): 40 mg via ORAL
  Filled 2021-02-07 (×2): qty 1

## 2021-02-07 MED ORDER — OXYCODONE HCL 5 MG PO TABS: 5.0000 mg | ORAL_TABLET | ORAL | Status: DC | PRN

## 2021-02-07 MED ORDER — GABAPENTIN 300 MG PO CAPS
ORAL_CAPSULE | ORAL | Status: AC
  Administered 2021-02-07: 300 mg via ORAL
  Filled 2021-02-07: qty 1

## 2021-02-07 MED ORDER — CHLORHEXIDINE GLUCONATE 0.12 % MT SOLN: 15.0000 mL | Freq: Once | OROMUCOSAL | Status: DC

## 2021-02-07 MED ORDER — 0.9 % SODIUM CHLORIDE (POUR BTL) OPTIME
TOPICAL | Status: DC | PRN
  Administered 2021-02-07: 500 mL

## 2021-02-07 MED ORDER — PROPOFOL 500 MG/50ML IV EMUL
INTRAVENOUS | Status: DC | PRN
  Administered 2021-02-07: 50 ug/kg/min via INTRAVENOUS

## 2021-02-07 MED ORDER — MIDAZOLAM HCL 2 MG/2ML IJ SOLN
INTRAMUSCULAR | Status: AC
  Filled 2021-02-07: qty 2

## 2021-02-07 MED ORDER — DEXMEDETOMIDINE HCL 200 MCG/2ML IV SOLN
INTRAVENOUS | Status: DC | PRN
  Administered 2021-02-07: 6 ug via INTRAVENOUS

## 2021-02-07 MED ORDER — SODIUM CHLORIDE FLUSH 0.9 % IV SOLN
INTRAVENOUS | Status: AC
  Filled 2021-02-07: qty 40

## 2021-02-07 MED ORDER — PROPOFOL 1000 MG/100ML IV EMUL
INTRAVENOUS | Status: AC
  Filled 2021-02-07: qty 100

## 2021-02-07 MED ORDER — ONDANSETRON HCL 4 MG/2ML IJ SOLN: 4.0000 mg | Freq: Four times a day (QID) | INTRAMUSCULAR | Status: DC | PRN

## 2021-02-07 MED ORDER — AMLODIPINE BESYLATE 5 MG PO TABS
5.0000 mg | ORAL_TABLET | Freq: Every evening | ORAL | Status: DC
  Administered 2021-02-07: 5 mg via ORAL
  Filled 2021-02-07: qty 1

## 2021-02-07 MED ORDER — MAGNESIUM HYDROXIDE 400 MG/5ML PO SUSP
30.0000 mL | Freq: Every day | ORAL | Status: DC
  Administered 2021-02-07 – 2021-02-08 (×2): 30 mL via ORAL
  Filled 2021-02-07 (×2): qty 30

## 2021-02-07 MED ORDER — BISACODYL 10 MG RE SUPP: 10.0000 mg | Freq: Every day | RECTAL | Status: DC | PRN

## 2021-02-07 MED ORDER — ENSURE PRE-SURGERY PO LIQD
296.0000 mL | Freq: Once | ORAL | Status: DC
  Filled 2021-02-07: qty 296

## 2021-02-07 MED ORDER — ACETAMINOPHEN 10 MG/ML IV SOLN
INTRAVENOUS | Status: AC
  Filled 2021-02-07: qty 100

## 2021-02-07 SURGICAL SUPPLY — 72 items
ATTUNE PS FEM RT SZ 3 CEM KNEE (Femur) ×1 IMPLANT
ATTUNE PSRP INSR SZ3 6 KNEE (Insert) ×1 IMPLANT
BASEPLATE TIBIAL ROTATING SZ 4 (Knees) ×1 IMPLANT
BATTERY INSTRU NAVIGATION (MISCELLANEOUS) ×8 IMPLANT
BLADE SAW 70X12.5 (BLADE) ×2 IMPLANT
BLADE SAW 90X13X1.19 OSCILLAT (BLADE) ×2 IMPLANT
BLADE SAW 90X25X1.19 OSCILLAT (BLADE) ×2 IMPLANT
CEMENT HV SMART SET (Cement) ×2 IMPLANT
COOLER POLAR GLACIER W/PUMP (MISCELLANEOUS) ×2 IMPLANT
CUFF TOURN SGL QUICK 24 (TOURNIQUET CUFF) ×1
CUFF TOURN SGL QUICK 34 (TOURNIQUET CUFF)
CUFF TRNQT CYL 24X4X16.5-23 (TOURNIQUET CUFF) IMPLANT
CUFF TRNQT CYL 34X4.125X (TOURNIQUET CUFF) IMPLANT
DRAPE 3/4 80X56 (DRAPES) ×2 IMPLANT
DRSG DERMACEA 8X12 NADH (GAUZE/BANDAGES/DRESSINGS) ×2 IMPLANT
DRSG MEPILEX SACRM 8.7X9.8 (GAUZE/BANDAGES/DRESSINGS) ×2 IMPLANT
DRSG OPSITE POSTOP 4X14 (GAUZE/BANDAGES/DRESSINGS) ×2 IMPLANT
DRSG TEGADERM 4X4.75 (GAUZE/BANDAGES/DRESSINGS) ×2 IMPLANT
DURAPREP 26ML APPLICATOR (WOUND CARE) ×4 IMPLANT
ELECT CAUTERY BLADE 6.4 (BLADE) ×2 IMPLANT
ELECT REM PT RETURN 9FT ADLT (ELECTROSURGICAL) ×2
ELECTRODE REM PT RTRN 9FT ADLT (ELECTROSURGICAL) ×1 IMPLANT
EX-PIN ORTHOLOCK NAV 4X150 (PIN) ×4 IMPLANT
GAUZE 4X4 16PLY ~~LOC~~+RFID DBL (SPONGE) ×2 IMPLANT
GLOVE SURG ENC MOIS LTX SZ7.5 (GLOVE) ×4 IMPLANT
GLOVE SURG ENC TEXT LTX SZ7.5 (GLOVE) ×4 IMPLANT
GLOVE SURG UNDER LTX SZ8 (GLOVE) ×2 IMPLANT
GLOVE SURG UNDER POLY LF SZ7.5 (GLOVE) ×2 IMPLANT
GOWN STRL REUS W/ TWL LRG LVL3 (GOWN DISPOSABLE) ×2 IMPLANT
GOWN STRL REUS W/ TWL XL LVL3 (GOWN DISPOSABLE) ×1 IMPLANT
GOWN STRL REUS W/TWL LRG LVL3 (GOWN DISPOSABLE) ×2
GOWN STRL REUS W/TWL XL LVL3 (GOWN DISPOSABLE) ×1
HEMOVAC 400CC 10FR (MISCELLANEOUS) ×2 IMPLANT
HOLDER FOLEY CATH W/STRAP (MISCELLANEOUS) ×2 IMPLANT
HOOD PEEL AWAY FLYTE STAYCOOL (MISCELLANEOUS) ×4 IMPLANT
IRRIGATION SURGIPHOR STRL (IV SOLUTION) ×2 IMPLANT
IV NS IRRIG 3000ML ARTHROMATIC (IV SOLUTION) ×2 IMPLANT
KIT TURNOVER KIT A (KITS) ×2 IMPLANT
KNIFE SCULPS 14X20 (INSTRUMENTS) ×2 IMPLANT
LABEL OR SOLS (LABEL) ×2 IMPLANT
MANIFOLD NEPTUNE II (INSTRUMENTS) ×4 IMPLANT
NDL SAFETY ECLIPSE 18X1.5 (NEEDLE) ×1 IMPLANT
NDL SPNL 20GX3.5 QUINCKE YW (NEEDLE) ×2 IMPLANT
NEEDLE HYPO 18GX1.5 SHARP (NEEDLE) ×1
NEEDLE SPNL 20GX3.5 QUINCKE YW (NEEDLE) ×4 IMPLANT
NS IRRIG 500ML POUR BTL (IV SOLUTION) ×2 IMPLANT
PACK TOTAL KNEE (MISCELLANEOUS) ×2 IMPLANT
PAD WRAPON POLAR KNEE (MISCELLANEOUS) ×1 IMPLANT
PATELLA MEDIAL ATTUN 35MM KNEE (Knees) ×1 IMPLANT
PENCIL SMOKE EVACUATOR COATED (MISCELLANEOUS) ×2 IMPLANT
PIN DRILL FIX HALF THREAD (BIT) ×4 IMPLANT
PIN FIXATION 1/8DIA X 3INL (PIN) ×2 IMPLANT
PULSAVAC PLUS IRRIG FAN TIP (DISPOSABLE) ×2
SOL PREP PVP 2OZ (MISCELLANEOUS) ×2
SOLUTION PREP PVP 2OZ (MISCELLANEOUS) ×1 IMPLANT
SPONGE DRAIN TRACH 4X4 STRL 2S (GAUZE/BANDAGES/DRESSINGS) ×2 IMPLANT
SPONGE T-LAP 18X18 ~~LOC~~+RFID (SPONGE) ×6 IMPLANT
STAPLER SKIN PROX 35W (STAPLE) ×2 IMPLANT
STOCKINETTE IMPERV 14X48 (MISCELLANEOUS) IMPLANT
STRAP TIBIA SHORT (MISCELLANEOUS) ×2 IMPLANT
SUCTION FRAZIER HANDLE 10FR (MISCELLANEOUS) ×1
SUCTION TUBE FRAZIER 10FR DISP (MISCELLANEOUS) ×1 IMPLANT
SUT VIC AB 0 CT1 36 (SUTURE) ×4 IMPLANT
SUT VIC AB 1 CT1 36 (SUTURE) ×4 IMPLANT
SUT VIC AB 2-0 CT2 27 (SUTURE) ×2 IMPLANT
SYR 20ML LL LF (SYRINGE) ×2 IMPLANT
SYR 30ML LL (SYRINGE) ×4 IMPLANT
TIP FAN IRRIG PULSAVAC PLUS (DISPOSABLE) ×1 IMPLANT
TOWEL OR 17X26 4PK STRL BLUE (TOWEL DISPOSABLE) ×2 IMPLANT
TOWER CARTRIDGE SMART MIX (DISPOSABLE) ×2 IMPLANT
TRAY FOLEY MTR SLVR 16FR STAT (SET/KITS/TRAYS/PACK) ×2 IMPLANT
WRAPON POLAR PAD KNEE (MISCELLANEOUS) ×2

## 2021-02-07 NOTE — Anesthesia Procedure Notes (Signed)
Spinal  Patient location during procedure: OR Start time: 02/07/2021 12:06 PM Reason for block: surgical anesthesia Staffing Performed: resident/CRNA  Anesthesiologist: Emmie Niemann, MD Resident/CRNA: Fredderick Phenix, CRNA Preanesthetic Checklist Completed: patient identified, IV checked, site marked, risks and benefits discussed, surgical consent, monitors and equipment checked, pre-op evaluation and timeout performed Spinal Block Patient position: sitting Prep: ChloraPrep Patient monitoring: heart rate, blood pressure and continuous pulse ox Approach: midline Location: L3-4 Injection technique: single-shot Needle Needle type: Quincke  Needle gauge: 22 G Needle length: 9 cm Assessment Sensory level: T4 Events: CSF return

## 2021-02-07 NOTE — H&P (Signed)
The patient has been re-examined, and the chart reviewed, and there have been no interval changes to the documented history and physical.    The risks, benefits, and alternatives have been discussed at length. The patient expressed understanding of the risks benefits and agreed with plans for surgical intervention.  Aidynn Polendo P. Dorrian Doggett, Jr. M.D.    

## 2021-02-07 NOTE — Progress Notes (Signed)
PT Cancellation Note  Patient Details Name: Ozelia Friess MRN: [id number] DOB: Jun 25, 1941   Cancelled Treatment:    Reason Eval/Treat Not Completed: Medical issues which prohibited therapy: Pt with only trace bilateral ankle AROM and not appropriate to participate with PT services at this time.  Will attempt to see pt at a future date/time as medically appropriate.    Linus Salmons PT, DPT 02/07/21, 4:59 PM

## 2021-02-07 NOTE — Anesthesia Preprocedure Evaluation (Signed)
Anesthesia Evaluation  Patient identified by MRN, date of birth, ID band Patient awake    Reviewed: Allergy & Precautions, NPO status , Patient's Chart, lab work & pertinent test results  History of Anesthesia Complications Negative for: history of anesthetic complications  Airway Mallampati: II  TM Distance: >3 FB Neck ROM: Full    Dental no notable dental hx.    Pulmonary neg pulmonary ROS, neg sleep apnea, neg COPD,    breath sounds clear to auscultation- rhonchi (-) wheezing      Cardiovascular Exercise Tolerance: Good hypertension, Pt. on medications (-) CAD, (-) Past MI, (-) Cardiac Stents and (-) CABG  Rhythm:Regular Rate:Normal - Systolic murmurs and - Diastolic murmurs    Neuro/Psych neg Seizures negative neurological ROS  negative psych ROS   GI/Hepatic Neg liver ROS, GERD  ,  Endo/Other  negative endocrine ROSneg diabetes  Renal/GU negative Renal ROS     Musculoskeletal  (+) Arthritis ,   Abdominal (+) - obese,   Peds  Hematology negative hematology ROS (+)   Anesthesia Other Findings Past Medical History: No date: Actinic keratosis No date: Allergic state No date: Arthritis 07/31/2012: Closed fracture of right proximal humerus No date: Dysrhythmia No date: GERD (gastroesophageal reflux disease) No date: History of shingles No date: Hyperlipidemia No date: Hypertension 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 No date: Seasonal allergies No date: Vertigo   Reproductive/Obstetrics                             Lab Results  Component Value Date   WBC 5.0 01/24/2021   HGB 12.7 01/24/2021   HCT 38.3 01/24/2021   MCV 91.8 01/24/2021   PLT 268 01/24/2021    Anesthesia Physical Anesthesia Plan  ASA: 2  Anesthesia Plan: Spinal   Post-op Pain Management:    Induction:   PONV Risk Score and Plan: 2 and Propofol infusion  Airway  Management Planned: Natural Airway  Additional Equipment:   Intra-op Plan:   Post-operative Plan:   Informed Consent: I have reviewed the patients History and Physical, chart, labs and discussed the procedure including the risks, benefits and alternatives for the proposed anesthesia with the patient or authorized representative who has indicated his/her understanding and acceptance.     Dental advisory given  Plan Discussed with: CRNA and Anesthesiologist  Anesthesia Plan Comments:         Anesthesia Quick Evaluation

## 2021-02-07 NOTE — Plan of Care (Signed)
  Problem: Education: Goal: Knowledge of General Education information will improve Description: Including pain rating scale, medication(s)/side effects and non-pharmacologic comfort measures Outcome: Progressing   Problem: Health Behavior/Discharge Planning: Goal: Ability to manage health-related needs will improve Outcome: Progressing   Problem: Clinical Measurements: Goal: Ability to maintain clinical measurements within normal limits will improve Outcome: Progressing Goal: Will remain free from infection Outcome: Progressing Goal: Diagnostic test results will improve Outcome: Progressing Goal: Respiratory complications will improve Outcome: Progressing Goal: Cardiovascular complication will be avoided Outcome: Progressing   Problem: Activity: Goal: Risk for activity intolerance will decrease Outcome: Progressing   Problem: Nutrition: Goal: Adequate nutrition will be maintained Outcome: Progressing   Problem: Coping: Goal: Level of anxiety will decrease Outcome: Progressing   Problem: Elimination: Goal: Will not experience complications related to bowel motility Outcome: Progressing Goal: Will not experience complications related to urinary retention Outcome: Progressing   Problem: Pain Managment: Goal: General experience of comfort will improve Outcome: Progressing   Problem: Education: Goal: Knowledge of the prescribed therapeutic regimen will improve Outcome: Progressing Goal: Individualized Educational Video(s) Outcome: Progressing   Problem: Activity: Goal: Ability to avoid complications of mobility impairment will improve Outcome: Progressing Goal: Range of joint motion will improve Outcome: Progressing   Problem: Pain Management: Goal: Pain level will decrease with appropriate interventions Outcome: Progressing

## 2021-02-07 NOTE — Transfer of Care (Signed)
Immediate Anesthesia Transfer of Care Note  Patient: Terri [name]  Procedure(s) Performed: COMPUTER ASSISTED TOTAL KNEE ARTHROPLASTY - RNFA (Right: Knee)  Patient Location: PACU  Anesthesia Type:Spinal  Level of Consciousness: awake, alert  and oriented  Airway & Oxygen Therapy: Patient Spontanous Breathing  Post-op Assessment: Report given to RN and Post -op Vital signs reviewed and stable  Post vital signs: Reviewed and stable  Last Vitals:  Vitals Value Taken Time  BP 104/59 02/07/21 1516  Temp    Pulse 77 02/07/21 1519  Resp 15 02/07/21 1519  SpO2 99 % 02/07/21 1519  Vitals shown include unvalidated device data.  Last Pain:  Vitals:   02/07/21 0943  TempSrc: Temporal  PainSc: 5          Complications: No notable events documented.

## 2021-02-07 NOTE — Op Note (Signed)
OPERATIVE NOTE  DATE OF SURGERY:  02/07/2021  PATIENT NAME:  Jazminne Mechling   DOB: 1941/02/05  MRN: [id number]  PRE-OPERATIVE DIAGNOSIS: Degenerative arthrosis of the right knee, primary  POST-OPERATIVE DIAGNOSIS:  Same  PROCEDURE:  Right total knee arthroplasty using computer-assisted navigation  SURGEON:  Marciano Sequin. M.D.  ANESTHESIA: spinal  ESTIMATED BLOOD LOSS: 50 mL  FLUIDS REPLACED: 1000 mL of crystalloid  TOURNIQUET TIME: 83 minutes  DRAINS: 2 medium Hemovac drains  SOFT TISSUE RELEASES: Anterior cruciate ligament, posterior cruciate ligament, deep medial collateral ligament, patellofemoral ligament  IMPLANTS UTILIZED: DePuy Attune size 3 posterior stabilized femoral component (cemented), size 4 rotating platform tibial component (cemented), 35 mm medialized dome patella (cemented), and a 6 mm stabilized rotating platform polyethylene insert.  INDICATIONS FOR SURGERY: Nadelyn Chrisp is a 80 y.o. year old female with a long history of progressive knee pain. X-rays demonstrated severe degenerative changes in tricompartmental fashion. The patient had not seen any significant improvement despite conservative nonsurgical intervention. After discussion of the risks and benefits of surgical intervention, the patient expressed understanding of the risks benefits and agree with plans for total knee arthroplasty.   The risks, benefits, and alternatives were discussed at length including but not limited to the risks of infection, bleeding, nerve injury, stiffness, blood clots, the need for revision surgery, cardiopulmonary complications, among others, and they were willing to proceed.  PROCEDURE IN DETAIL: The patient was brought into the operating room and, after adequate spinal anesthesia was achieved, a tourniquet was placed on the patient's upper thigh. The patient's knee and leg were cleaned and prepped with alcohol and DuraPrep and draped in the usual sterile fashion. A  "timeout" was performed as per usual protocol. The lower extremity was exsanguinated using an Esmarch, and the tourniquet was inflated to 300 mmHg. An anterior longitudinal incision was made followed by a standard mid vastus approach. The deep fibers of the medial collateral ligament were elevated in a subperiosteal fashion off of the medial flare of the tibia so as to maintain a continuous soft tissue sleeve. The patella was subluxed laterally and the patellofemoral ligament was incised. Inspection of the knee demonstrated severe degenerative changes with full-thickness loss of articular cartilage. Osteophytes were debrided using a rongeur. Anterior and posterior cruciate ligaments were excised. Two 4.0 mm Schanz pins were inserted in the femur and into the tibia for attachment of the array of trackers used for computer-assisted navigation. Hip center was identified using a circumduction technique. Distal landmarks were mapped using the computer. The distal femur and proximal tibia were mapped using the computer. The distal femoral cutting guide was positioned using computer-assisted navigation so as to achieve a 5 distal valgus cut. The femur was sized and it was felt that a size 3 femoral component was appropriate. A size 3 femoral cutting guide was positioned and the anterior cut was performed and verified using the computer. This was followed by completion of the posterior and chamfer cuts. Femoral cutting guide for the central box was then positioned in the center box cut was performed.  Attention was then directed to the proximal tibia. Medial and lateral menisci were excised. The extramedullary tibial cutting guide was positioned using computer-assisted navigation so as to achieve a 0 varus-valgus alignment and 3 posterior slope. The cut was performed and verified using the computer. The proximal tibia was sized and it was felt that a size 4 tibial tray was appropriate. Tibial and femoral trials were  inserted followed by insertion  of a 6 mm polyethylene insert. This allowed for excellent mediolateral soft tissue balancing both in flexion and in full extension. Finally, the patella was cut and prepared so as to accommodate a 35 mm medialized dome patella. A patella trial was placed and the knee was placed through a range of motion with excellent patellar tracking appreciated. The femoral trial was removed after debridement of posterior osteophytes. The central post-hole for the tibial component was reamed followed by insertion of a keel punch. Tibial trials were then removed. Cut surfaces of bone were irrigated with copious amounts of normal saline using pulsatile lavage and then suctioned dry. Polymethylmethacrylate cement was prepared in the usual fashion using a vacuum mixer. Cement was applied to the cut surface of the proximal tibia as well as along the undersurface of a size 4 rotating platform tibial component. Tibial component was positioned and impacted into place. Excess cement was removed using [employer] fast streamer. Cement was then applied to the cut surfaces of the femur as well as along the posterior flanges of the size 3 femoral component. The femoral component was positioned and impacted into place. Excess cement was removed using [employer] fast streamer. A 6 mm polyethylene trial was inserted and the knee was brought into full extension with steady axial compression applied. Finally, cement was applied to the backside of a 35 mm medialized dome patella and the patellar component was positioned and patellar clamp applied. Excess cement was removed using [employer] fast streamer. After adequate curing of the cement, the tourniquet was deflated after a total tourniquet time of 83 minutes. Hemostasis was achieved using electrocautery. The knee was irrigated with copious amounts of normal saline using pulsatile lavage followed by 500 ml of Surgiphor and then suctioned dry. 20 mL of 1.3% Exparel and 60 mL of 0.25% Marcaine  in 40 mL of normal saline was injected along the posterior capsule, medial and lateral gutters, and along the arthrotomy site. A 6 mm stabilized rotating platform polyethylene insert was inserted and the knee was placed through a range of motion with excellent mediolateral soft tissue balancing appreciated and excellent patellar tracking noted. 2 medium drains were placed in the wound bed and brought out through separate stab incisions. The medial parapatellar portion of the incision was reapproximated using interrupted sutures of #1 Vicryl. Subcutaneous tissue was approximated in layers using first #0 Vicryl followed #2-0 Vicryl. The skin was approximated with skin staples. A sterile dressing was applied.  The patient tolerated the procedure well and was transported to the recovery room in stable condition.    Jimmy Plessinger P. Holley Bouche., M.D.

## 2021-02-08 DIAGNOSIS — M1711 Unilateral primary osteoarthritis, right knee: Secondary | ICD-10-CM | POA: Diagnosis not present

## 2021-02-08 MED ORDER — ENOXAPARIN SODIUM 40 MG/0.4ML IJ SOSY: 40.0000 mg | PREFILLED_SYRINGE | INTRAMUSCULAR | 0 refills | Status: DC

## 2021-02-08 MED ORDER — TRAMADOL HCL 50 MG PO TABS: 50.0000 mg | ORAL_TABLET | ORAL | 0 refills | Status: DC | PRN

## 2021-02-08 MED ORDER — OXYCODONE HCL 5 MG PO TABS: 5.0000 mg | ORAL_TABLET | ORAL | 0 refills | Status: DC | PRN

## 2021-02-08 MED ORDER — SENNOSIDES-DOCUSATE SODIUM 8.6-50 MG PO TABS: 1.0000 | ORAL_TABLET | Freq: Two times a day (BID) | ORAL | 0 refills | Status: DC

## 2021-02-08 MED ORDER — FLEET ENEMA 7-19 GM/118ML RE ENEM
1.0000 | ENEMA | Freq: Once | RECTAL | 0 refills | Status: DC | PRN
Start: 2021-02-08 — End: 2023-07-26

## 2021-02-08 MED ORDER — CELECOXIB 200 MG PO CAPS: 200.0000 mg | ORAL_CAPSULE | Freq: Two times a day (BID) | ORAL | 0 refills | Status: DC

## 2021-02-08 NOTE — Progress Notes (Signed)
   Subjective: 1 Day Post-Op Procedure(s) (LRB): COMPUTER ASSISTED TOTAL KNEE ARTHROPLASTY - RNFA (Right) Patient reports pain as mild.   Patient is well, and has had no acute complaints or problems Denies any CP, SOB, ABD pain. We will continue therapy today.  Plan is to go Home after hospital stay.  Objective: Vital signs in last 24 hours: Temp:  [97.2 F (36.2 C)-98.6 F (37 C)] 98.6 F (37 C) (07/23 0801) Pulse Rate:  [71-94] 71 (07/23 0801) Resp:  [12-18] 18 (07/23 0801) BP: (104-150)/(59-72) 112/59 (07/23 0801) SpO2:  [97 %-100 %] 100 % (07/23 0801) Weight:  [60.1 kg] 60.1 kg (07/22 0943)  Intake/Output from previous day: 07/22 0701 - 07/23 0700 In: 2450 [P.O.:240; I.V.:1650; IV Piggyback:560] Out: 1055 [Urine:800; Drains:205; Blood:50] Intake/Output this shift: No intake/output data recorded.  No results for input(s): HGB in the last 72 hours. No results for input(s): WBC, RBC, HCT, PLT in the last 72 hours. No results for input(s): NA, K, CL, CO2, BUN, CREATININE, GLUCOSE, CALCIUM in the last 72 hours. No results for input(s): LABPT, INR in the last 72 hours.  EXAM General - Patient is Alert, Appropriate, and Oriented Extremity - Neurovascular intact Sensation intact distally Intact pulses distally Dorsiflexion/Plantar flexion intact No cellulitis present Compartment soft Dressing - dressing C/D/I and no drainage, Hemovac intact Motor Function - intact, moving foot and toes well on exam.   Past Medical History:  Diagnosis Date   Actinic keratosis    Allergic state    Arthritis    Closed fracture of right proximal humerus 07/31/2012   Dysrhythmia    GERD (gastroesophageal reflux disease)    History of shingles    Hyperlipidemia    Hypertension    Prolapse of female pelvic organs    Renal duplication    Seasonal allergies    Vertigo     Assessment/Plan:   1 Day Post-Op Procedure(s) (LRB): COMPUTER ASSISTED TOTAL KNEE ARTHROPLASTY - RNFA  (Right) Active Problems:   Total knee replacement status  Estimated body mass index is 22.39 kg/m as calculated from the following:   Height as of this encounter: 5' 4.5" (1.638 m).   Weight as of this encounter: 60.1 kg. Advance diet Up with therapy Patient doing well.  Pain well controlled. Vital signs are stable. Plan on discharge to home with home health PT today pending good progress with physical therapy.  We will remove Hemovac prior to discharge.   DVT Prophylaxis - Lovenox, Foot Pumps, and TED hose Weight-Bearing as tolerated to right leg   T. Rachelle Hora, PA-C [place] 02/08/2021, 9:12 AM

## 2021-02-08 NOTE — TOC Transition Note (Signed)
Transition of Care [facility]) - CM/SW Discharge Note   Patient Details  Name: Terri [name] MRN: [id number] Date of Birth: 08-Jun-1941  [facility] CM/SW Contact:  Terri Price, RN Phone Number: 02/08/2021, 12:57 PM   Clinical Narrative:  Patient to be discharged today. Spouse to transport home. [facility] per Terri [name] is already set up. Patient states she has all needed DME, though none specifically ordered. Terri Davies RN CM      Final next level of care: [place] Barriers to Discharge: Barriers Resolved   Patient Goals and CMS Choice        Discharge Placement                       Discharge Plan and Services                DME Arranged: N/A (Patient states she has needed DME) DME Agency: [facility] ([place])         [place]: Adrian Date [place]: 02/08/21 Time [place]: 1200 Representative spoke with at [place]: Terri [name] (To confirm)  Social Determinants of Health (SDOH) Interventions     Readmission Risk Interventions No flowsheet data found.

## 2021-02-08 NOTE — Evaluation (Signed)
Physical Therapy Evaluation Patient Details Name: Terri [name] MRN: [id number] DOB: [birth year] Today's Date: 02/08/2021   History of Present Illness  Terri [name] is an 80 y.o. female who presents with primary osteoarthritis of the R knee. She is now s/p R TKA. PMH includes HLD, HTN, pelvic organ prolapse, and arthritis.    Clinical Impression  Pt received seated in recliner upon arrival to room.  Pt agreeable to therapy and looking forward to ambulating out of the room.  Pt with good technique to stand from chair and is able to place adequate weight through the R LE.  Pt then navigated room and around the nursing station x2 before attempting stair training.  Pt with good recall/carryover with instructions for stair training and is able to perform without much difficulty.  Pt then transferred to room and was left in bed awaiting d/c orders.  Pt will benefit from skilled PT intervention to increase independence and safety with basic mobility in preparation for discharge to the venue listed below.        Follow Up Recommendations Home health PT    Equipment Recommendations  Rolling walker with 5" wheels    Recommendations for Other Services       Precautions / Restrictions Precautions Precautions: Fall;Knee Restrictions Weight Bearing Restrictions: Yes RLE Weight Bearing: Weight bearing as tolerated      Mobility  Bed Mobility Overal bed mobility: Needs Assistance Bed Mobility: Sit to Supine     Supine to sit: Modified independent (Device/Increase time);HOB elevated Sit to supine: Supervision   General bed mobility comments: Seated in recliner upon arrival; supervision for return to bed.    Transfers Overall transfer level: Needs assistance Equipment used: Rolling walker (2 wheeled) Transfers: Sit to/from [facility] Sit to Stand: Supervision Stand pivot transfers: Supervision       General transfer comment: Min cueing for  safety/sequencing.  Ambulation/Gait Ambulation/Gait assistance: Min guard Gait Distance (Feet): 320 Feet Assistive device: Rolling walker (2 wheeled) Gait Pattern/deviations: WFL(Within Functional Limits);Step-through pattern;Decreased step length - left;Decreased stance time - right Gait velocity: decreased   General Gait Details: Pt ambulates with adequate speed, performing step-through by the end of the session.  Stairs Stairs: Yes Stairs assistance: Min guard Stair Management: One rail Right;Step to pattern;Sideways Number of Stairs: 4 General stair comments: Good confidence and recall  Wheelchair Mobility    Modified Rankin (Stroke Patients Only)       Balance Overall balance assessment: Needs assistance Sitting-balance support: Feet supported;No upper extremity supported Sitting balance-Leahy Scale: Good Sitting balance - Comments: Steady static sitting, EOB.   Standing balance support: During functional activity;Bilateral upper extremity supported Standing balance-Leahy Scale: Good                               Pertinent Vitals/Pain Pain Assessment: No/denies pain    Home Living Family/patient expects to be discharged to:: Private residence Living Arrangements: Spouse/significant other Available Help at Discharge: Family;Available 24 hours/day (Dtr plans to come to stay with her as well for first few days after sx.) Type of Home: House Home Access: Stairs to enter Entrance Stairs-Rails: Right Entrance Stairs-Number of Steps: 3 Home Layout: Able to live on main level with bedroom/bathroom;Two level Home Equipment: Shower seat - built in;Walker - 2 wheels;Bedside commode;Adaptive equipment      Prior Function Level of Independence: Independent         Comments: Pt active and independent at baseline.  Denies use of AE/DME for ADL management. Enjoys gardening, wallking, going to the gym. +driving.     Hand Dominance   Dominant Hand: Left     Extremity/Trunk Assessment   Upper Extremity Assessment Upper Extremity Assessment: Overall WFL for tasks assessed;Defer to OT evaluation    Lower Extremity Assessment Lower Extremity Assessment: Overall WFL for tasks assessed;RLE deficits/detail RLE Deficits / Details: s/p R TKA RLE Coordination: decreased gross motor    Cervical / Trunk Assessment Cervical / Trunk Assessment: Normal  Communication   Communication: No difficulties  Cognition Arousal/Alertness: Awake/alert Behavior During Therapy: WFL for tasks assessed/performed Overall Cognitive Status: Within Functional Limits for tasks assessed                                        General Comments      Exercises Other Exercises Other Exercises: Pt/caregiver (spouse) instructed in falls prevention strategies, safe use of AE/DME for LB ADL management, compression stocking management, polar care management, and routines modifications to support safety and fxl independence during meaningful occupations of daily life. Other Exercises: Pt and spouse were educated on the roles of PT and the services provided during hospital stay.  Pt also encouraged to perform HEP packet given to her.   Assessment/Plan    PT Assessment Patient needs continued PT services  PT Problem List Decreased strength;Decreased mobility       PT Treatment Interventions DME instruction;Gait training;Stair training;Therapeutic activities;Therapeutic exercise    PT Goals (Current goals can be found in the Care Plan section)  Acute Rehab PT Goals Patient Stated Goal: To get back to the gym    Frequency BID   Barriers to discharge        Co-evaluation               AM-PAC PT "6 Clicks" Mobility  Outcome Measure Help needed turning from your back to your side while in a flat bed without using bedrails?: None Help needed moving from lying on your back to sitting on the side of a flat bed without using bedrails?: None Help  needed moving to and from a bed to a chair (including a wheelchair)?: None Help needed standing up from a chair using your arms (e.g., wheelchair or bedside chair)?: None Help needed to walk in hospital room?: A Little Help needed climbing 3-5 steps with a railing? : A Little 6 Click Score: 22    End of Session Equipment Utilized During Treatment: Gait belt Activity Tolerance: Patient tolerated treatment well Patient left: in bed;with call bell/phone within reach;with family/visitor present Nurse Communication: Mobility status PT Visit Diagnosis: Muscle weakness (generalized) (M62.81)    Time: [id number] PT Time Calculation (min) (ACUTE ONLY): 33 min   Charges:   PT Evaluation $PT Eval Low Complexity: 1 Low PT Treatments $Gait Training: 23-37 mins        Gwenlyn Saran, PT, DPT 02/08/21, 1:12 PM   Christie Nottingham 02/08/2021, 1:12 PM

## 2021-02-08 NOTE — TOC Progression Note (Signed)
Transition of Care [facility]) - Progression Note    Patient Details  Name: Terri [name] MRN: [id number] Date of Birth: 06-27-1941  [facility] CM/SW Contact  Izola Price, RN Phone Number: 02/08/2021, 9:39 AM  Clinical Narrative:  7/23: POD#1 s/p Right TKR 02/07/21. Patient is pre-scheduled and set up via [place] for discharge [place] needs per [facility] [facility]'s weekly prescheduled email list. [facility] will monitor patient for further discharge planning/needs. Simmie Davies RN CM          Expected Discharge Plan and Services                                                 Social Determinants of Health (SDOH) Interventions    Readmission Risk Interventions No flowsheet data found.

## 2021-02-08 NOTE — Discharge Summary (Signed)
Physician Discharge Summary  Patient ID: Terri [name] MRN: [id number] DOB/AGE: 81-Apr-1942 81 y.o.  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   Numbness 02/26/2015   Pessary maintenance 11/06/2014   Midline cystocele 11/06/2014   Uterovaginal prolapse, incomplete 11/06/2014   Renal cyst, acquired, right 07/10/2014   Female genital prolapse, unspecified 07/09/2014   Microscopic hematuria [id number]   Renal duplication [id number]   Urge incontinence 09/18/2013   Uterine prolapse 09/18/2013    Past Medical History:  Diagnosis Date   Actinic keratosis    Allergic state    Arthritis    Closed fracture of right proximal humerus 07/31/2012   Dysrhythmia    GERD (gastroesophageal reflux disease)    History of shingles    Hyperlipidemia    Hypertension    Prolapse of female pelvic organs    Renal duplication    Seasonal allergies    Vertigo      Transfusion: None   Consultants (if any):   Discharged Condition: Improved  Hospital Course: Terri [name] is an 80 y.o. female who was admitted 02/07/2021 with a diagnosis of right knee osteoarthritis and went to the operating room on 02/07/2021 and underwent the above named procedures.    Surgeries: Procedure(s): COMPUTER ASSISTED TOTAL KNEE ARTHROPLASTY - RNFA on 02/07/2021 Patient tolerated the surgery well. Taken to PACU where she was stabilized and then transferred to the orthopedic floor.  Started on Lovenox 30 mg q 12 hrs. Foot pumps applied bilaterally at 80 mm. Heels elevated on bed with rolled towels. No evidence of DVT. Negative Homan. Physical therapy started on day #1 for gait training and transfer. OT started day #1 for ADL and  assisted devices.  Patient's foley was d/c on day #1. Patient's IV and hemovac was d/c on day #1.  On post op day #1 patient completed all physical therapy goals and was able to safely ambulate independently. Patient was stable and ready for discharge to home with home health PT.  Implants: DePuy Attune size 3 posterior stabilized femoral component (cemented), size 4 rotating platform tibial component (cemented), 35 mm medialized dome patella (cemented), and a 6 mm stabilized rotating platform polyethylene insert.  She was given perioperative antibiotics:  Anti-infectives (From admission, onward)    Start     Dose/Rate Route Frequency Ordered Stop   02/07/21 1600  ceFAZolin (ANCEF) IVPB 2g/100 mL premix        2 g 200 mL/hr over 30 Minutes Intravenous Every 6 hours 02/07/21 1532 02/08/21 0827   02/07/21 0600  ceFAZolin (ANCEF) IVPB 2g/100 mL premix  Status:  Discontinued        2 g 200 mL/hr over 30 Minutes Intravenous On call to O.R. 02/07/21 0023 02/07/21 [id number]     .  She was given sequential compression devices, early ambulation, and Lovenox, teds for DVT prophylaxis.  She benefited maximally from the hospital stay and there were no complications.    Recent vital signs:  Vitals:   02/07/21 2042 02/08/21 0801  BP: 133/70 (!) 112/59  Pulse: 71 71  Resp: 17 18  Temp: 97.6 F (36.4 C) 98.6 F (37 C)  SpO2: 97% 100%    Recent laboratory studies:  Lab Results  Component Value Date   HGB  12.7 01/24/2021   HGB 12.0 12/14/2018   Lab Results  Component Value Date   WBC 5.0 01/24/2021   PLT 268 01/24/2021   No results found for: INR Lab Results  Component Value Date   NA 140 01/24/2021   K 3.6 01/24/2021   CL 105 01/24/2021   CO2 28 01/24/2021   BUN 17 01/24/2021   CREATININE 0.56 01/24/2021   GLUCOSE 96 01/24/2021    Discharge Medications:   Allergies as of 02/08/2021       Reactions   Sulfa Antibiotics Hives, Rash        Medication List     STOP taking  these medications    aspirin 81 MG chewable tablet   meloxicam 7.5 MG tablet Commonly known as: MOBIC       TAKE these medications    acetaminophen 500 MG tablet Commonly known as: TYLENOL Take 500 mg by mouth every 6 (six) hours as needed for moderate pain.   amLODipine 5 MG tablet Commonly known as: NORVASC Take 5 mg by mouth every evening.   BLACK ELDERBERRY PO Take by mouth.   calcium carbonate 500 MG chewable tablet Commonly known as: TUMS - dosed in mg elemental calcium Chew 500 mg by mouth daily as needed for indigestion or heartburn.   celecoxib 200 MG capsule Commonly known as: CELEBREX Take 1 capsule (200 mg total) by mouth 2 (two) times daily.   enoxaparin 40 MG/0.4ML injection Commonly known as: LOVENOX Inject 0.4 mLs (40 mg total) into the skin daily for 14 days.   ergocalciferol 1.25 MG (50000 UT) capsule Commonly known as: VITAMIN D2 Take 50,000 Units by mouth every Sunday.   loratadine 10 MG tablet Commonly known as: CLARITIN Take 10 mg by mouth daily as needed for allergies.   mometasone 0.1 % lotion Commonly known as: ELOCON Apply topically as directed. Qd to bid aa scalp prn flares What changed:  how much to take when to take this reasons to take this additional instructions   oxyCODONE 5 MG immediate release tablet Commonly known as: Oxy IR/ROXICODONE Take 1-2 tablets (5-10 mg total) by mouth every 4 (four) hours as needed for moderate pain (pain score 4-6).   senna-docusate 8.6-50 MG tablet Commonly known as: Senokot-S Take 1 tablet by mouth 2 (two) times daily.   simvastatin 20 MG tablet Commonly known as: ZOCOR Take 20 mg by mouth every evening.   sodium phosphate 7-19 GM/118ML Enem Place 133 mLs (1 enema total) rectally once as needed for severe constipation.   traMADol 50 MG tablet Commonly known as: ULTRAM Take 1-2 tablets (50-100 mg total) by mouth every 4 (four) hours as needed for moderate pain.   triamcinolone cream  0.1 % Commonly known as: KENALOG Apply topically 2 (two) times daily. Apply to bug bites QD-BID PRN. Avoid face, groin, and axilla. What changed:  how much to take when to take this reasons to take this additional instructions   vitamin B-12 1000 MCG tablet Commonly known as: CYANOCOBALAMIN Take 1,000 mcg by mouth every evening.   VITAMIN C PO Take 1 capsule by mouth daily.   ZINC PO Take 1 capsule by mouth daily.               Durable Medical Equipment  (From admission, onward)           Start     Ordered   02/07/21 1627  DME Walker rolling  Once       Question:  Patient needs a walker to treat with the following condition  Answer:  Total knee replacement status   02/07/21 1626   02/07/21 1627  DME Bedside commode  Once       Question:  Patient needs a bedside commode to treat with the following condition  Answer:  Total knee replacement status   02/07/21 1626            Diagnostic Studies: DG Knee Right Port  Result Date: 02/07/2021 CLINICAL DATA:  80 year old female status post total right knee arthroplasty. EXAM: PORTABLE RIGHT KNEE - 1-2 VIEW COMPARISON:  None. FINDINGS: There is a total right knee arthroplasty. The arthroplasty components appear intact and in anatomic alignment. No evidence of loosening. There is no acute fracture or dislocation. The bones are osteopenic. A drainage tube is noted in the suprapatellar space. Anterior knee cutaneous clips. IMPRESSION: Status post total right knee arthroplasty. No acute fracture or dislocation. Electronically Signed   By: Anner Crete M.D.   On: 02/07/2021 15:55    Disposition: Discharge disposition: 06-Home-Health Care Svc          Follow-up Information     Fausto Skillern, PA-C Follow up on 02/21/2021.   Specialty: Orthopedic Surgery Why: at 1:15pm Contact information: [place] [address] [phone]          Dereck Leep, MD Follow up on 03/27/2021.   Specialty: Orthopedic Surgery Why: at 10:30am Contact information: [place] [address] [phone]                  Signed: Feliberto Gottron 02/08/2021, 10:45 AM

## 2021-02-08 NOTE — Evaluation (Signed)
Occupational Therapy Evaluation Patient Details Name: Terri [name] MRN: [id number] DOB: 1941-03-13 Today's Date: 02/08/2021    History of Present Illness Terri [name] is an 80 y.o. female who presents with primary osteoarthritis of the R knee. She is now s/p R TKA. PMH includes HLD, HTN, pelvic organ prolapse, and arthritis.   Clinical Impression   [name] was seen for OT evaluation this date, POD#1 from above surgery. Pt was independent in all ADLs prior to surgery. She endorses one non-injurious fall in last year (fell outside when gardening), and denies use of AE for functional mobility. Pt is eager to return to PLOF, including going back to the gym when she is able, with less pain and improved safety and independence. Pt currently requires minimal assist for LB dressing while in seated position due to pain and limited AROM of R knee. Pt/caregiver instructed in polar care mgt, falls prevention strategies, home/routines modifications, DME/AE for LB bathing and dressing tasks, and compression stocking mgt. Pt would benefit from skilled OT services including additional instruction in dressing techniques with or without assistive devices for dressing and bathing skills to support recall and carryover prior to discharge and ultimately to maximize safety, independence, and minimize falls risk and caregiver burden. Do not currently anticipate any OT needs following this hospitalization.       Follow Up Recommendations  No OT follow up    Equipment Recommendations  None recommended by OT (Pt has necessary equipment.)    Recommendations for Other Services       Precautions / Restrictions Precautions Precautions: Fall;Knee Restrictions Weight Bearing Restrictions: Yes RLE Weight Bearing: Weight bearing as tolerated      Mobility Bed Mobility Overal bed mobility: Needs Assistance Bed Mobility: Supine to Sit     Supine to sit: Modified independent (Device/Increase time);HOB  elevated     General bed mobility comments: Min increased time/effort to perform.    Transfers Overall transfer level: Needs assistance Equipment used: Rolling walker (2 wheeled) Transfers: Sit to/from [facility] Sit to Stand: Supervision Stand pivot transfers: Supervision       General transfer comment: Min cueing for safety/sequencing.    Balance Overall balance assessment: Needs assistance Sitting-balance support: Feet supported;No upper extremity supported Sitting balance-Leahy Scale: Good Sitting balance - Comments: Steady static sitting, EOB.   Standing balance support: During functional activity;Bilateral upper extremity supported Standing balance-Leahy Scale: Good                             ADL either performed or assessed with clinical judgement   ADL Overall ADL's : Needs assistance/impaired                         Toilet Transfer: BSC;Set up;Supervision/safety;RW   Toileting- Clothing Manipulation and Hygiene: Supervision/safety;Set up;Sit to/from stand       Functional mobility during ADLs: Supervision/safety;Set up;Rolling walker General ADL Comments: Pt functionally limited by increased pain and decreased AROM of her R knee. She requires supervision for safety during functional mobility. Anticipate supervision to MIN A for LB dressing tasks including donning compression stockings. Spouse present at bedside and eager to assist t/o session.     Vision Baseline Vision/History: Wears glasses Wears Glasses: At all times Patient Visual Report: No change from baseline       Perception     Praxis      Pertinent Vitals/Pain Pain Assessment: No/denies pain  Hand Dominance Left   Extremity/Trunk Assessment Upper Extremity Assessment Upper Extremity Assessment: Overall WFL for tasks assessed   Lower Extremity Assessment Lower Extremity Assessment: Overall WFL for tasks assessed;RLE deficits/detail;Defer to PT  evaluation RLE Deficits / Details: s/p R TKA RLE Coordination: decreased gross motor   Cervical / Trunk Assessment Cervical / Trunk Assessment: Normal   Communication Communication Communication: No difficulties   Cognition Arousal/Alertness: Awake/alert Behavior During Therapy: WFL for tasks assessed/performed Overall Cognitive Status: Within Functional Limits for tasks assessed                                     General Comments       Exercises Other Exercises Other Exercises: Pt/caregiver (spouse) instructed in falls prevention strategies, safe use of AE/DME for LB ADL management, compression stocking management, polar care management, and routines modifications to support safety and fxl independence during meaningful occupations of daily life.   Shoulder Instructions      Home Living Family/patient expects to be discharged to:: Private residence Living Arrangements: Spouse/significant other Available Help at Discharge: Family;Available 24 hours/day (Dtr plans to come to stay with her as well for first few days after sx.) Type of Home: House Home Access: Stairs to enter [employer] of Steps: 3 Entrance Stairs-Rails: Right Home Layout: Able to live on main level with bedroom/bathroom;Two level     Bathroom Shower/Tub: Occupational psychologist:  (comfort height) Bathroom Accessibility: Yes How Accessible: Accessible via walker Home Equipment: Shower seat - built in;Walker - 2 wheels;Bedside commode;Adaptive equipment Adaptive Equipment: Reacher        Prior Functioning/Environment Level of Independence: Independent        Comments: Pt active and independent at baseline. Denies use of AE/DME for ADL management. Enjoys gardening, wallking, going to the gym. +driving.        OT Problem List: Decreased coordination;Decreased safety awareness;Impaired balance (sitting and/or standing);Decreased knowledge of use of DME or  AE;Decreased knowledge of precautions      OT Treatment/Interventions: Self-care/ADL training;Therapeutic exercise;Therapeutic activities;DME and/or AE instruction;Visual/perceptual remediation/compensation;Patient/family education    OT Goals(Current goals can be found in the care plan section) Acute Rehab OT Goals Patient Stated Goal: To get back to the gym OT Goal Formulation: With patient Time For Goal Achievement: 02/22/21 Potential to Achieve Goals: Good ADL Goals Pt Will Perform Lower Body Dressing: with modified independence;with adaptive equipment;sit to/from stand Pt Will Transfer to Toilet: with modified independence;bedside commode;ambulating  OT Frequency: Min 1X/week   Barriers to D/C:            Co-evaluation              AM-PAC OT "6 Clicks" Daily Activity     Outcome Measure Help from another person eating meals?: None Help from another person taking care of personal grooming?: None Help from another person toileting, which includes using toliet, bedpan, or urinal?: A Little Help from another person bathing (including washing, rinsing, drying)?: A Little Help from another person to put on and taking off regular upper body clothing?: None Help from another person to put on and taking off regular lower body clothing?: A Little 6 Click Score: 21   End of Session Equipment Utilized During Treatment: Gait belt;Rolling walker  Activity Tolerance: Patient tolerated treatment well Patient left: in chair;with chair alarm set;with SCD's reapplied (With polar care on and in place.)  OT Visit Diagnosis:  Other abnormalities of gait and mobility (R26.89)                Time: [id number] OT Time Calculation (min): 34 min Charges:  OT General Charges $OT Visit: 1 Visit OT Evaluation $OT Eval Low Complexity: 1 Low OT Treatments $Self Care/Home Management : 23-37 mins  Shara Blazing, M.S., OTR/L Ascom: [phone] 02/08/21, 11:28 AM

## 2021-02-10 ENCOUNTER — Encounter: Payer: Self-pay | Admitting: Orthopedic Surgery

## 2021-02-10 NOTE — Anesthesia Postprocedure Evaluation (Signed)
Anesthesia Post Note  Patient: Macil Stromgren  Procedure(s) Performed: COMPUTER ASSISTED TOTAL KNEE ARTHROPLASTY - RNFA (Right: Knee)  Patient location during evaluation: PACU Anesthesia Type: Spinal Level of consciousness: awake and alert and oriented Pain management: pain level controlled Vital Signs Assessment: post-procedure vital signs reviewed and stable Respiratory status: spontaneous breathing, nonlabored ventilation and respiratory function stable Cardiovascular status: blood pressure returned to baseline and stable Postop Assessment: no signs of nausea or vomiting Anesthetic complications: no   No notable events documented.   Last Vitals:  Vitals:   02/07/21 2042 02/08/21 0801  BP: 133/70 (!) 112/59  Pulse: 71 71  Resp: 17 18  Temp: 36.4 C 37 C  SpO2: 97% 100%    Last Pain:  Vitals:   02/08/21 0410  TempSrc:   PainSc: Asleep                 Maicie Vanderloop

## 2021-02-13 ENCOUNTER — Other Ambulatory Visit: Payer: Self-pay | Admitting: Anesthesiology

## 2021-04-29 ENCOUNTER — Encounter: Payer: Medicare HMO | Admitting: Dermatology

## 2021-05-06 ENCOUNTER — Ambulatory Visit: Payer: Medicare HMO | Admitting: Dermatology

## 2021-05-26 ENCOUNTER — Other Ambulatory Visit: Payer: Self-pay

## 2021-05-26 ENCOUNTER — Ambulatory Visit: Payer: Medicare HMO | Admitting: Dermatology

## 2021-05-26 DIAGNOSIS — L719 Rosacea, unspecified: Secondary | ICD-10-CM | POA: Diagnosis not present

## 2021-05-26 DIAGNOSIS — L570 Actinic keratosis: Secondary | ICD-10-CM | POA: Diagnosis not present

## 2021-05-26 DIAGNOSIS — D229 Melanocytic nevi, unspecified: Secondary | ICD-10-CM

## 2021-05-26 DIAGNOSIS — L720 Epidermal cyst: Secondary | ICD-10-CM

## 2021-05-26 DIAGNOSIS — L219 Seborrheic dermatitis, unspecified: Secondary | ICD-10-CM | POA: Diagnosis not present

## 2021-05-26 DIAGNOSIS — L578 Other skin changes due to chronic exposure to nonionizing radiation: Secondary | ICD-10-CM

## 2021-05-26 DIAGNOSIS — Z1283 Encounter for screening for malignant neoplasm of skin: Secondary | ICD-10-CM | POA: Diagnosis not present

## 2021-05-26 DIAGNOSIS — L814 Other melanin hyperpigmentation: Secondary | ICD-10-CM

## 2021-05-26 DIAGNOSIS — D1800 Hemangioma unspecified site: Secondary | ICD-10-CM

## 2021-05-26 DIAGNOSIS — I781 Nevus, non-neoplastic: Secondary | ICD-10-CM

## 2021-05-26 DIAGNOSIS — D692 Other nonthrombocytopenic purpura: Secondary | ICD-10-CM

## 2021-05-26 DIAGNOSIS — L821 Other seborrheic keratosis: Secondary | ICD-10-CM

## 2021-05-26 MED ORDER — METRONIDAZOLE 1 % EX GEL: Freq: Every day | CUTANEOUS | 6 refills | Status: DC

## 2021-05-26 NOTE — Patient Instructions (Signed)

## 2021-05-26 NOTE — Progress Notes (Signed)
Follow-Up Visit   Subjective  Terri [name] is a 80 y.o. female who presents for the following: Total body skin exam (Hx of AKs).  Has h/o seb derm scalp which is improved with topical mometasone solution prn and H&S shampoo   The following portions of the chart were reviewed this encounter and updated as appropriate:       Review of Systems:  No other skin or systemic complaints except as noted in HPI or Assessment and Plan.  Objective  Well appearing patient in no apparent distress; mood and affect are within normal limits.  A full examination was performed including scalp, head, eyes, ears, nose, lips, neck, chest, axillae, abdomen, back, buttocks, bilateral upper extremities, bilateral lower extremities, hands, feet, fingers, toes, fingernails, and toenails. All findings within normal limits unless otherwise noted below.  nasal dorsum x 1, R malar cheek x 2 (3) Pink scaly macules   Scalp Scalp clear  face Erythema mid face  Upper sternum 6.88mm pink/brown macule, no scale  R ant thigh superior scar 2.44mm dilated pore  bil legs Telangiectasias bil legs  L temple x1, L infra ocular x 1 (2) Stuck-on, waxy, tan-brown papules-- Discussed benign etiology and prognosis.         Assessment & Plan  AK (actinic keratosis) (3) nasal dorsum x 1, R malar cheek x 2  Actinic keratoses are precancerous spots that appear secondary to cumulative UV radiation exposure/sun exposure over time. They are chronic with expected duration over 1 year. A portion of actinic keratoses will progress to squamous cell carcinoma of the skin. It is not possible to reliably predict which spots will progress to skin cancer and so treatment is recommended to prevent development of skin cancer.  Recommend daily broad spectrum sunscreen SPF 30+ to sun-exposed areas, reapply every 2 hours as needed.  Recommend staying in the shade or wearing long sleeves, sun glasses (UVA+UVB protection) and wide  brim hats (4-inch brim around the entire circumference of the hat). Call for new or changing lesions.   Destruction of lesion - nasal dorsum x 1, R malar cheek x 2  Destruction method: cryotherapy   Informed consent: discussed and consent obtained   Lesion destroyed using liquid nitrogen: Yes   Region frozen until ice ball extended beyond lesion: Yes   Outcome: patient tolerated procedure well with no complications   Post-procedure details: wound care instructions given   Additional details:  Prior to procedure, discussed risks of blister formation, small wound, skin dyspigmentation, or rare scar following cryotherapy. Recommend Vaseline ointment to treated areas while healing.   Seborrheic dermatitis Scalp  Controlled  Seborrheic Dermatitis  -  is a chronic persistent rash characterized by pinkness and scaling most commonly of the mid face but also can occur on the scalp (dandruff), ears; mid chest, mid back and groin.  It tends to be exacerbated by stress and cooler weather.  People who have neurologic disease may experience new onset or exacerbation of existing seborrheic dermatitis.  The condition is not curable but treatable and can be controlled.  Cont Mometasone sol qd prn flares Cont Head and Shoulders shampoo   Related Medications mometasone (ELOCON) 0.1 % lotion Apply topically as directed. Qd to bid aa scalp prn flares  Rosacea face  Rosacea is a chronic progressive skin condition usually affecting the face of adults, causing redness and/or acne bumps. It is treatable but not curable. It sometimes affects the eyes (ocular rosacea) as well. It may respond to topical  and/or systemic medication and can flare with stress, sun exposure, alcohol, exercise and some foods.  Daily application of broad spectrum spf 30+ sunscreen to face is recommended to reduce flares.  Start Metronidazole 1% cream qhs  metroNIDAZOLE (METROGEL) 1 % gel - face Apply topically daily. Qhs to face  for rosacea  Lentigines Upper sternum  Irritated  Benign appearing, observe  Epidermal cyst R ant thigh superior scar  Discussed excising if recurs  Extraction today for diagnosis, no charge    Acne/Milia surgery - R ant thigh superior scar Procedure risks and benefits were discussed with the patient and verbal consent was obtained. Following prep of the skin on the R knee with an alcohol swab, extraction of comedones was performed with a comedone extractor. Vaseline ointment was applied to each site. The patient tolerated the procedure well.  Spider veins bil legs  Benign, observe.    Seborrheic keratosis (2) L temple x1, L infra ocular x 1  Benign  Discussed cosmetic procedure (cryotherapy), noncovered.  $60 for 1st lesion and $15 for each additional lesion if done on the same day.  Maximum charge $350.  One touch-up treatment included no charge. Discussed risks of treatment including dyspigmentation, small scar, and/or recurrence. Recommend daily broad spectrum sunscreen SPF 30+/photoprotection to treated areas once healed.   Destruction of lesion - L temple x1, L infra ocular x 1  Destruction method: cryotherapy   Informed consent: discussed and consent obtained   Lesion destroyed using liquid nitrogen: Yes   Region frozen until ice ball extended beyond lesion: Yes   Outcome: patient tolerated procedure well with no complications   Post-procedure details: wound care instructions given   Additional details:  Prior to procedure, discussed risks of blister formation, small wound, skin dyspigmentation, or rare scar following cryotherapy. Recommend Vaseline ointment to treated areas while healing.   Lentigines - Scattered tan macules - Due to sun exposure - Benign-appearing, observe - Recommend daily broad spectrum sunscreen SPF 30+ to sun-exposed areas, reapply every 2 hours as needed. - Call for any changes  Seborrheic Keratoses - Stuck-on, waxy, tan-brown papules  and/or plaques  - Benign-appearing - Discussed benign etiology and prognosis. - Observe - Call for any changes  Melanocytic Nevi - Tan-brown and/or pink-flesh-colored symmetric macules and papules - Benign appearing on exam today - Observation - Call clinic for new or changing moles - Recommend daily use of broad spectrum spf 30+ sunscreen to sun-exposed areas.  - R breast x 2  Hemangiomas - Red papules - Discussed benign nature - Observe - Call for any changes  Actinic Damage - Chronic condition, secondary to cumulative UV/sun exposure - diffuse scaly erythematous macules with underlying dyspigmentation - Recommend daily broad spectrum sunscreen SPF 30+ to sun-exposed areas, reapply every 2 hours as needed.  - Staying in the shade or wearing long sleeves, sun glasses (UVA+UVB protection) and wide brim hats (4-inch brim around the entire circumference of the hat) are also recommended for sun protection.  - Call for new or changing lesions. - face  Skin cancer screening performed today.  Purpura - Chronic; persistent and recurrent.  Treatable, but not curable. - Violaceous macules and patches - Benign - Related to trauma, age, sun damage and/or use of blood thinners, chronic use of topical and/or oral steroids - Observe - Can use OTC arnica containing moisturizer such as Dermend Bruise Formula if desired - Call for worsening or other concerns  Return in about 1 year (around 05/26/2022) for TBSE, Hx  of AKs.  I, Othelia Pulling, RMA, am acting as scribe for Brendolyn Patty, MD .  Documentation: I have reviewed the above documentation for accuracy and completeness, and I agree with the above.  Brendolyn Patty MD

## 2021-06-09 ENCOUNTER — Ambulatory Visit (INDEPENDENT_AMBULATORY_CARE_PROVIDER_SITE_OTHER): Payer: Medicare HMO | Admitting: Dermatology

## 2021-06-09 ENCOUNTER — Other Ambulatory Visit: Payer: Self-pay

## 2021-06-09 DIAGNOSIS — L821 Other seborrheic keratosis: Secondary | ICD-10-CM

## 2021-06-09 NOTE — Progress Notes (Signed)
   Follow-Up Visit   Subjective  Terri [name] is a 80 y.o. female who presents for the following: Cosmetic SKs to recheck (L temple, L infra ocular, LN2 on 05/26/21).   The following portions of the chart were reviewed this encounter and updated as appropriate:       Review of Systems:  No other skin or systemic complaints except as noted in HPI or Assessment and Plan.  Objective  Well appearing patient in no apparent distress; mood and affect are within normal limits.  A focused examination was performed including face. Relevant physical exam findings are noted in the Assessment and Plan.  L temple x 1 Residual stuck on waxy pap L temple L infraocular is clear with residual mild post inflammatory hyperpigmentation   Assessment & Plan  Seborrheic keratosis L temple x 1  Residual, free touch up treatment today to residual cosmetic SK  No charge for visit/procedure  Destruction of lesion - L temple x 1  Destruction method: cryotherapy   Informed consent: discussed and consent obtained   Lesion destroyed using liquid nitrogen: Yes   Region frozen until ice ball extended beyond lesion: Yes   Outcome: patient tolerated procedure well with no complications   Post-procedure details: wound care instructions given   Additional details:  Prior to procedure, discussed risks of blister formation, small wound, skin dyspigmentation, or rare scar following cryotherapy. Recommend Vaseline ointment to treated areas while healing.   Return for as scheduled.  I, Othelia Pulling, RMA, am acting as scribe for Brendolyn Patty, MD .  Documentation: I have reviewed the above documentation for accuracy and completeness, and I agree with the above.  Brendolyn Patty MD

## 2021-06-09 NOTE — Patient Instructions (Signed)

## 2021-06-23 ENCOUNTER — Encounter: Payer: Medicare HMO | Admitting: Dermatology

## 2021-07-17 ENCOUNTER — Other Ambulatory Visit: Payer: Self-pay | Admitting: Dermatology

## 2021-07-22 ENCOUNTER — Other Ambulatory Visit: Payer: Self-pay | Admitting: Obstetrics and Gynecology

## 2021-07-22 DIAGNOSIS — Z1231 Encounter for screening mammogram for malignant neoplasm of breast: Secondary | ICD-10-CM

## 2021-09-10 ENCOUNTER — Other Ambulatory Visit: Payer: Self-pay

## 2021-09-10 ENCOUNTER — Ambulatory Visit
Admission: RE | Admit: 2021-09-10 | Discharge: 2021-09-10 | Disposition: A | Discharge status: Home / Self Care | Payer: Medicare HMO | Source: Ambulatory Visit | Attending: Obstetrics and Gynecology | Admitting: Obstetrics and Gynecology

## 2021-09-10 DIAGNOSIS — Z1231 Encounter for screening mammogram for malignant neoplasm of breast: Secondary | ICD-10-CM | POA: Diagnosis not present

## 2022-03-11 IMAGING — MG MM DIGITAL SCREENING BILAT W/ TOMO AND CAD
6 of 10 series · 6 of 30 positions shown · non-contrast
Comparison: Previous exam(s).

CLINICAL DATA: Screening.

EXAM:
DIGITAL SCREENING BILATERAL MAMMOGRAM WITH TOMOSYNTHESIS AND CAD
TECHNIQUE: Bilateral screening digital craniocaudal and mediolateral oblique
mammograms were obtained. Bilateral screening digital breast
tomosynthesis was performed. The images were evaluated with
computer-aided detection.

[L CC synth-2D]
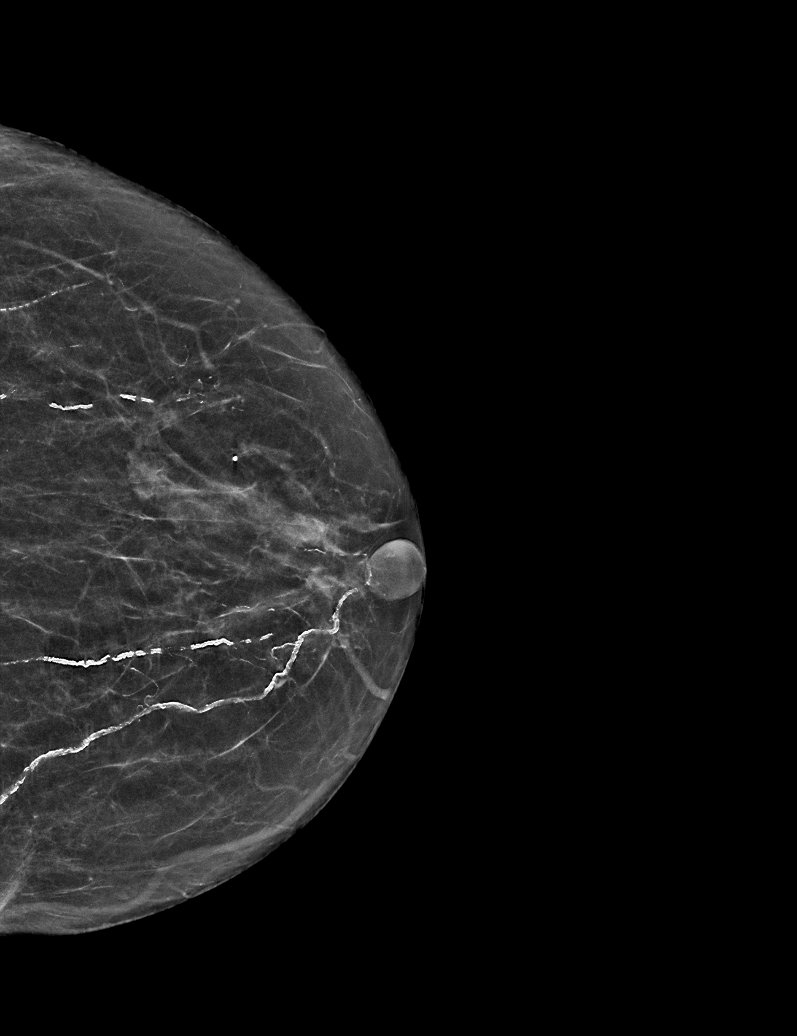

[R CC synth-2D]
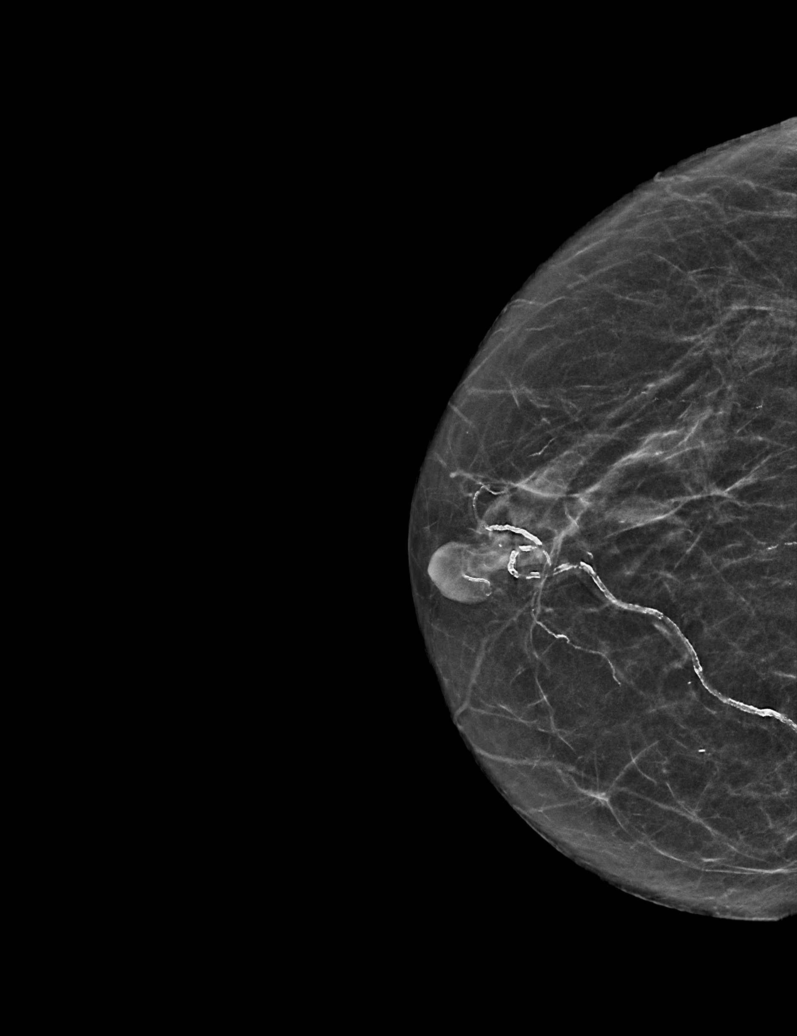

[R MLO synth-2D (1 of 2)]
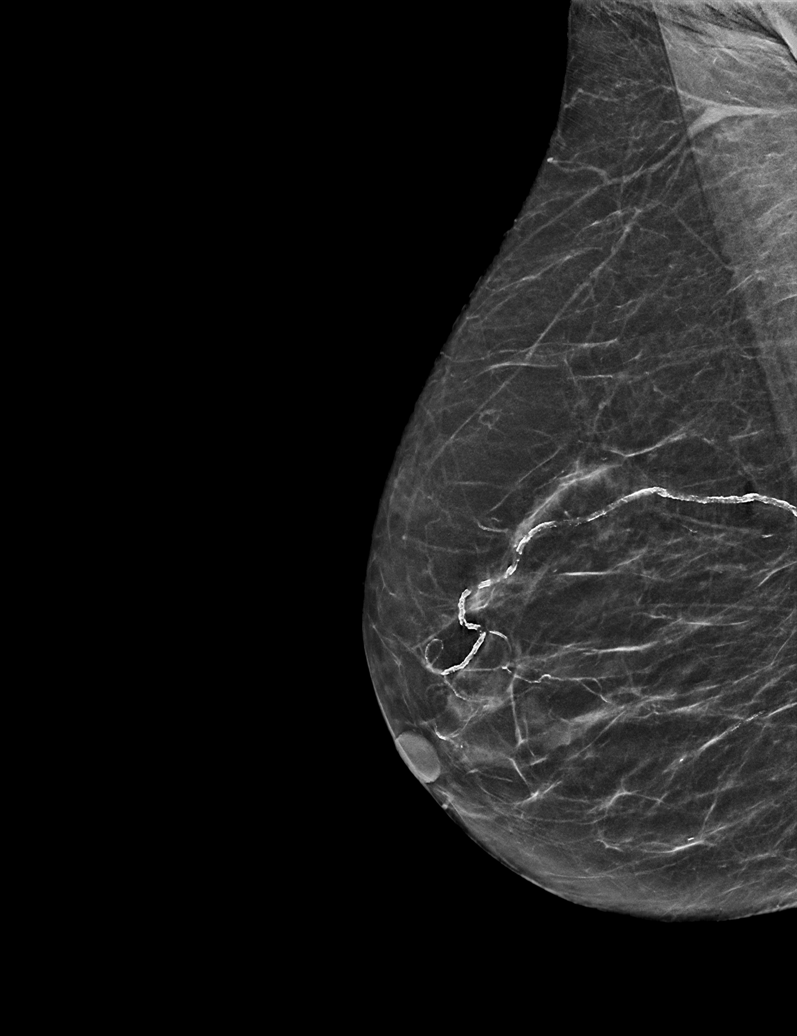

[R MLO synth-2D (2 of 2)]
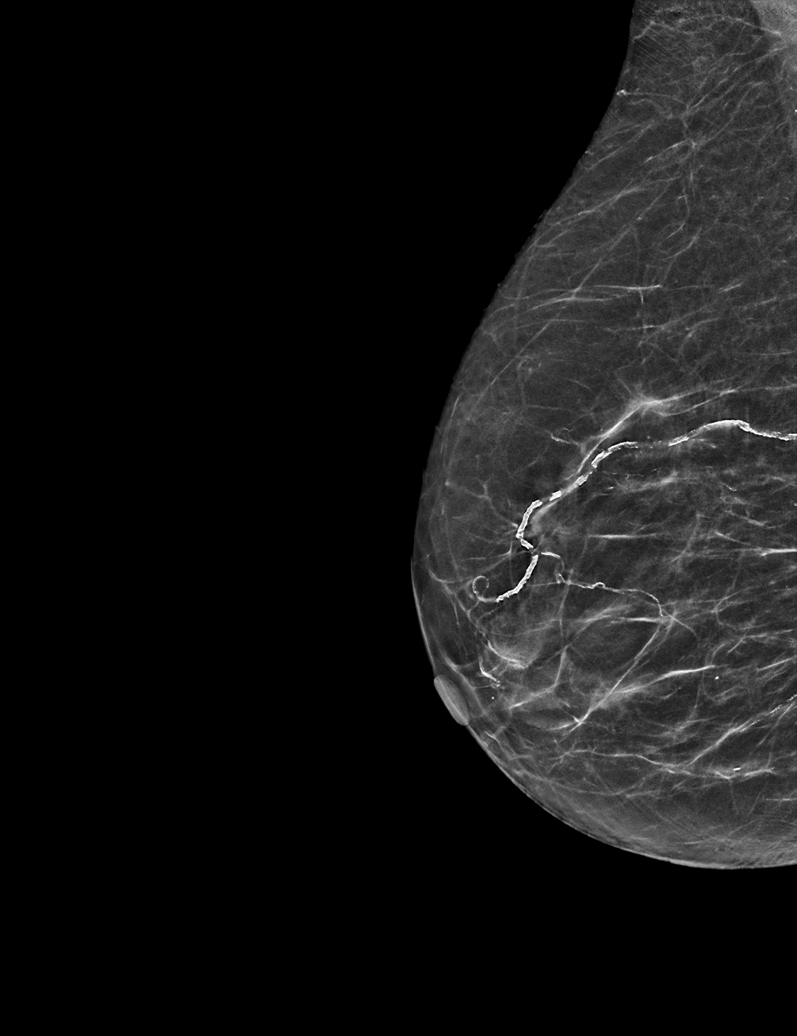

[L MLO synth-2D]
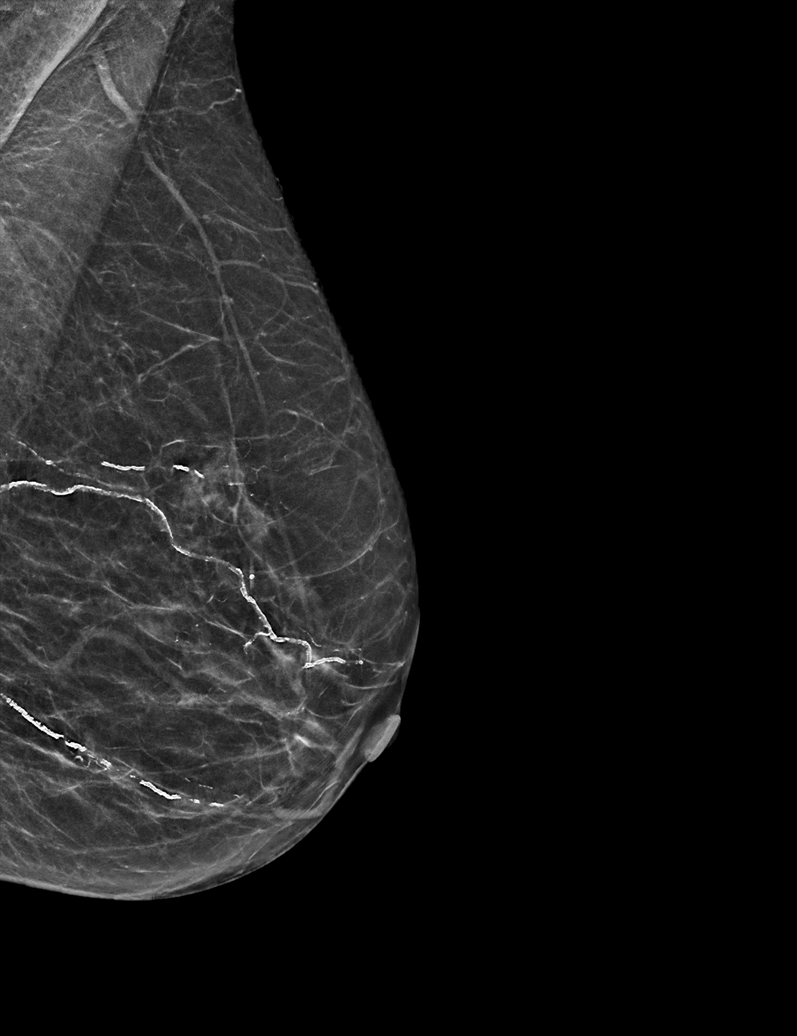

[L CC tomo · tomo slice 23/46.0]
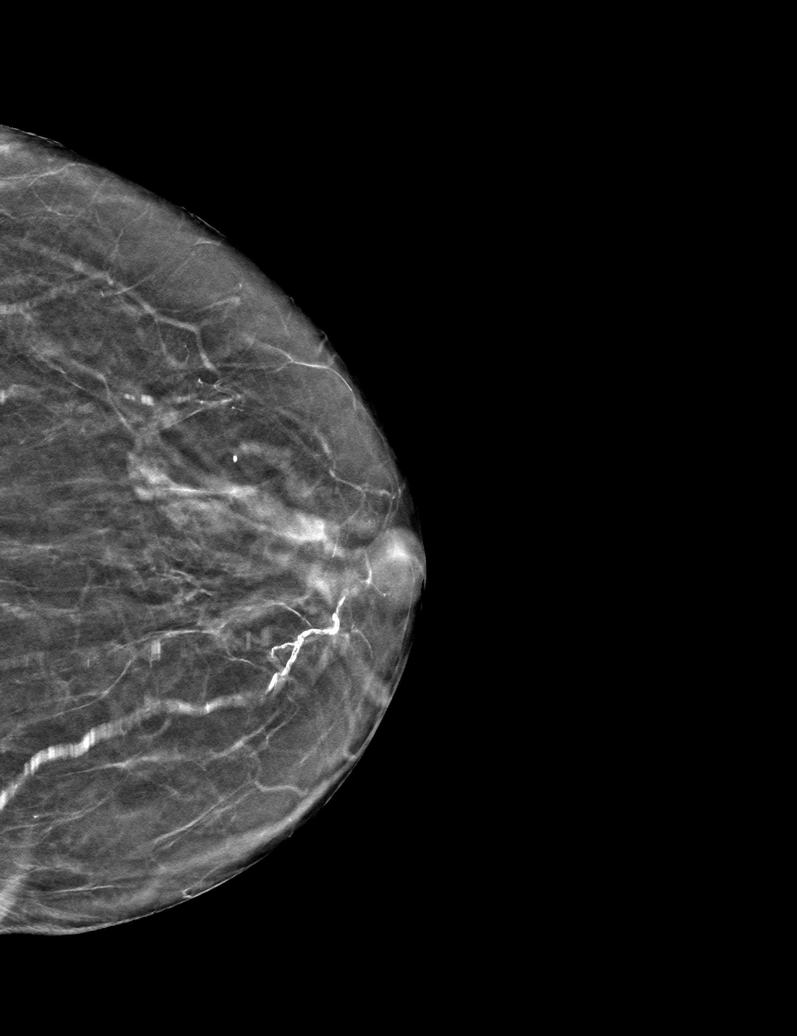

[6 of 30 positions shown; findings below may reference images not displayed]

ACR Breast Density Category b: There are scattered areas of
fibroglandular density.
FINDINGS: There are no findings suspicious for malignancy.
IMPRESSION: No mammographic evidence of malignancy. A result letter of this
screening mammogram will be mailed directly to the patient.

RECOMMENDATION:
Screening mammogram in one year. (Code:51-O-LD2)

BI-RADS CATEGORY  1: Negative.

## 2022-04-13 ENCOUNTER — Other Ambulatory Visit: Payer: Self-pay | Admitting: Orthopedic Surgery

## 2022-04-13 DIAGNOSIS — M47816 Spondylosis without myelopathy or radiculopathy, lumbar region: Secondary | ICD-10-CM

## 2022-04-30 ENCOUNTER — Ambulatory Visit
Admission: RE | Admit: 2022-04-30 | Discharge: 2022-04-30 | Disposition: A | Discharge status: Home / Self Care | Payer: Medicare HMO | Source: Ambulatory Visit | Attending: Orthopedic Surgery | Admitting: Orthopedic Surgery

## 2022-04-30 DIAGNOSIS — M47816 Spondylosis without myelopathy or radiculopathy, lumbar region: Secondary | ICD-10-CM

## 2022-05-26 ENCOUNTER — Ambulatory Visit: Payer: Medicare HMO | Admitting: Dermatology

## 2022-05-26 ENCOUNTER — Ambulatory Visit (INDEPENDENT_AMBULATORY_CARE_PROVIDER_SITE_OTHER): Payer: Medicare HMO | Admitting: Dermatology

## 2022-05-26 DIAGNOSIS — L719 Rosacea, unspecified: Secondary | ICD-10-CM

## 2022-05-26 DIAGNOSIS — S80861A Insect bite (nonvenomous), right lower leg, initial encounter: Secondary | ICD-10-CM

## 2022-05-26 DIAGNOSIS — D692 Other nonthrombocytopenic purpura: Secondary | ICD-10-CM

## 2022-05-26 DIAGNOSIS — D229 Melanocytic nevi, unspecified: Secondary | ICD-10-CM

## 2022-05-26 DIAGNOSIS — L603 Nail dystrophy: Secondary | ICD-10-CM

## 2022-05-26 DIAGNOSIS — Z1283 Encounter for screening for malignant neoplasm of skin: Secondary | ICD-10-CM | POA: Diagnosis not present

## 2022-05-26 DIAGNOSIS — S80862A Insect bite (nonvenomous), left lower leg, initial encounter: Secondary | ICD-10-CM

## 2022-05-26 DIAGNOSIS — L578 Other skin changes due to chronic exposure to nonionizing radiation: Secondary | ICD-10-CM

## 2022-05-26 DIAGNOSIS — Q825 Congenital non-neoplastic nevus: Secondary | ICD-10-CM

## 2022-05-26 DIAGNOSIS — L814 Other melanin hyperpigmentation: Secondary | ICD-10-CM

## 2022-05-26 DIAGNOSIS — L570 Actinic keratosis: Secondary | ICD-10-CM | POA: Diagnosis not present

## 2022-05-26 DIAGNOSIS — L821 Other seborrheic keratosis: Secondary | ICD-10-CM

## 2022-05-26 DIAGNOSIS — L219 Seborrheic dermatitis, unspecified: Secondary | ICD-10-CM | POA: Diagnosis not present

## 2022-05-26 DIAGNOSIS — W57XXXA Bitten or stung by nonvenomous insect and other nonvenomous arthropods, initial encounter: Secondary | ICD-10-CM

## 2022-05-26 MED ORDER — TRIAMCINOLONE ACETONIDE 0.1 % EX CREA: TOPICAL_CREAM | Freq: Two times a day (BID) | CUTANEOUS | 0 refills | Status: AC

## 2022-05-26 MED ORDER — METRONIDAZOLE 1 % EX GEL: Freq: Every day | CUTANEOUS | 6 refills | Status: AC

## 2022-05-26 MED ORDER — MOMETASONE FUROATE 0.1 % EX SOLN: CUTANEOUS | 2 refills | Status: DC

## 2022-05-26 MED ORDER — AMINOLEVULINIC ACID HCL 10 % EX GEL
2000.0000 mg | Freq: Once | CUTANEOUS | Status: AC
  Administered 2022-05-26: 2000 mg via TOPICAL

## 2022-05-26 NOTE — Patient Instructions (Addendum)
Head and shoulder supreme color protect   To strengthen fingernails  Avoid gel polish   Biotin 2.5 mg tab by mouth daily   Start dermanail strengthener apply topically to nails twice daily        Photodynamic Therapy/Red Light Therapy  Actinic keratoses are the dry, red scaly spots on the skin caused by sun damage. A portion of these spots can turn into skin cancer with time, and treating them can help prevent development of skin cancer.   Treatment of these spots requires removal of the defective skin cells. There are various ways to remove actinic keratoses, including freezing with liquid nitrogen, treatment with creams, or treatment with a red light procedure in the office.   Photodynamic Therapy (PDT), also known as "red light therapy" is an in office procedure used to treat actinic keratoses. It works by targeting precancerous cells. After treatment, these cells peel off and are replaced by healthy ones.   For your phototherapy appointment, you will have two appointments on the day of your treatment. The first appointment will be to apply a cream to the treatment area. You will leave this cream on for 1-2 hours depending on the area being treated. The second appointment will be to shine a blue light on the area for 16 minutes to kill off the precancer cells. It is common to experience a burning sensation during the treatment.  After your treatment, it will be important to keep the treated areas of skin out of the sun completely for 48-72 hours (2-3 days) to prevent having a reaction.   Common side effects include: - Burning or stinging, which may be severe and can last up to 24-72 hours after your treatment - Scaling and crusting which may last up to 2 weeks - Redness, swelling and/or peeling which can last up to 4 weeks  To Care for Your Skin After PDT/Red lightTherapy: - Wash with soap, water and shampoo as normal. - If needed, you can use cold compresses (e.g. ice packs) for  comfort - If okay with your primary care doctor, you may use analgesics such as acetaminophen (tylenol) every 4-6 hours, not to exceed recommended dose - You may apply Cerave Healing Ointment, Vaseline or Aquaphor as needed - If you have a lot of swelling you may take a Benadryl to help with this (this may cause drowsiness), not to exceed recommended dose. This may increase the risk of falls in people over 65 and may slow reaction time while driving, so it is not recommended to take before driving or operating machinery. - Sun Precautions - Wear a wide brim hat for the next week if outside  - Wear a sunblock with zinc or titanium dioxide at least SPF 50 daily  If you have any questions or concerns, please call the office and ask to speak with a nurse.   --------------------------------------------------------------------------------------------------------------     Seborrheic Keratosis  What causes seborrheic keratoses? Seborrheic keratoses are harmless, common skin growths that first appear during adult life.  As time goes by, more growths appear.  Some people may develop a large number of them.  Seborrheic keratoses appear on both covered and uncovered body parts.  They are not caused by sunlight.  The tendency to develop seborrheic keratoses can be inherited.  They vary in color from skin-colored to gray, brown, or even black.  They can be either smooth or have a rough, warty surface.   Seborrheic keratoses are superficial and look as if they were  stuck on the skin.  Under the microscope this type of keratosis looks like layers upon layers of skin.  That is why at times the top layer may seem to fall off, but the rest of the growth remains and re-grows.    Treatment Seborrheic keratoses do not need to be treated, but can easily be removed in the office.  Seborrheic keratoses often cause symptoms when they rub on clothing or jewelry.  Lesions can be in the way of shaving.  If they become  inflamed, they can cause itching, soreness, or burning.  Removal of a seborrheic keratosis can be accomplished by freezing, burning, or surgery. If any spot bleeds, scabs, or grows rapidly, please return to have it checked, as these can be an indication of a skin cancer.     Melanoma ABCDEs  Melanoma is the most dangerous type of skin cancer, and is the leading cause of death from skin disease.  You are more likely to develop melanoma if you: Have light-colored skin, light-colored eyes, or red or blond hair Spend a lot of time in the sun Tan regularly, either outdoors or in a tanning bed Have had blistering sunburns, especially during childhood Have a close family member who has had a melanoma Have atypical moles or large birthmarks  Early detection of melanoma is key since treatment is typically straightforward and cure rates are extremely high if we catch it early.   The first sign of melanoma is often a change in a mole or a new dark spot.  The ABCDE system is a way of remembering the signs of melanoma.  A for asymmetry:  The two halves do not match. B for border:  The edges of the growth are irregular. C for color:  A mixture of colors are present instead of an even brown color. D for diameter:  Melanomas are usually (but not always) greater than 81m - the size of a pencil eraser. E for evolution:  The spot keeps changing in size, shape, and color.  Please check your skin once per month between visits. You can use a small mirror in front and a large mirror behind you to keep an eye on the back side or your body.   If you see any new or changing lesions before your next follow-up, please call to schedule a visit.  Please continue daily skin protection including broad spectrum sunscreen SPF 30+ to sun-exposed areas, reapplying every 2 hours as needed when you're outdoors.   Staying in the shade or wearing long sleeves, sun glasses (UVA+UVB protection) and wide brim hats (4-inch brim  around the entire circumference of the hat) are also recommended for sun protection.    Due to recent changes in healthcare laws, you may see results of your pathology and/or laboratory studies on MyChart before the doctors have had a chance to review them. We understand that in some cases there may be results that are confusing or concerning to you. Please understand that not all results are received at the same time and often the doctors may need to interpret multiple results in order to provide you with the best plan of care or course of treatment. Therefore, we ask that you please give u[place]2 business days to thoroughly review all your results before contacting the office for clarification. Should we see a critical lab result, you will be contacted sooner.   If You Need Anything After Your Visit  If you have any questions or concerns for your  doctor, please call our main line at [phone] and press option 4 to reach your doctor's medical assistant. If no one answers, please leave a voicemail as directed and we will return your call as soon as possible. Messages left after 4 pm will be answered the following business day.   You may also send [place] a message via [place]. We typically respond to MyChart messages within 1-2 business days.  For prescription refills, please ask your pharmacy to contact our office. Our fax number is [phone].  If you have an urgent issue when the clinic is closed that cannot wait until the next business day, you can page your doctor at the number below.    Please note that while we do our best to be available for urgent issues outside of office hours, we are not available 24/7.   If you have an urgent issue and are unable to reach [place], you may choose to seek medical care at your doctor's office, retail clinic, urgent care center, or emergency room.  If you have a medical emergency, please immediately call 911 or go to the emergency department.  Pager Numbers  -  Dr. Nehemiah Massed: [phone]  - Dr. Laurence Ferrari: [phone]  - Dr. Nicole Kindred: [phone]  In the event of inclement weather, please call our main line at [phone] for an update on the status of any delays or closures.  Dermatology Medication Tips: Please keep the boxes that topical medications come in in order to help keep track of the instructions about where and how to use these. Pharmacies typically print the medication instructions only on the boxes and not directly on the medication tubes.   If your medication is too expensive, please contact our office at [phone] option 4 or send [place] a message through [place].   We are unable to tell what your co-pay for medications will be in advance as this is different depending on your insurance coverage. However, we may be able to find a substitute medication at lower cost or fill out paperwork to get insurance to cover a needed medication.   If a prior authorization is required to get your medication covered by your insurance company, please allow [place] 1-2 business days to complete this process.  Drug prices often vary depending on where the prescription is filled and some pharmacies may offer cheaper prices.  The website www.goodrx.com contains coupons for medications through different pharmacies. The prices here do not account for what the cost may be with help from insurance (it may be cheaper with your insurance), but the website can give you the price if you did not use any insurance.  - You can print the associated coupon and take it with your prescription to the pharmacy.  - You may also stop by our office during regular business hours and pick up a GoodRx coupon card.  - If you need your prescription sent electronically to a different pharmacy, notify our office through [facility] or by phone at [phone] option 4.     Si Usted Necesita Algo Despus de Su Visita  Tambin puede enviarnos un mensaje a travs de Pharmacist, community. Por  lo general respondemos a los mensajes de MyChart en el transcurso de 1 a 2 das hbiles.  Para renovar recetas, por favor pida a su farmacia que se ponga en contacto con nuestra oficina. Harland Dingwall de fax es [place] [phone].  Si tiene un asunto urgente cuando la clnica est cerrada y que no puede esperar hasta el siguiente  da hbil, puede llamar/localizar a su doctor(a) al nmero que aparece a continuacin.   Por favor, tenga en cuenta que aunque hacemos todo lo posible para estar disponibles para asuntos urgentes fuera del horario de [place], no estamos disponibles las 24 horas del da, los 7 das de la [place].   Si tiene un problema urgente y no puede comunicarse con nosotros, puede optar por buscar atencin mdica  en el consultorio de su doctor(a), en una clnica privada, en un centro de atencin urgente o en una sala de emergencias.  Si tiene Engineering geologist, por favor llame inmediatamente al 911 o vaya a la sala de emergencias.  Nmeros de bper  - Dr. Nehemiah Massed: [phone]  - Dra. Moye: [phone]  - Dra. Nicole Kindred: [phone]  En caso de inclemencias del [place], por favor llame a Johnsie Kindred principal al [phone] para una actualizacin sobre el [place] de cualquier retraso o cierre.  Consejos para la medicacin en dermatologa: Por favor, guarde las cajas en las que vienen los medicamentos de uso tpico para ayudarle a seguir las instrucciones sobre dnde y cmo usarlos. Las farmacias generalmente imprimen las instrucciones del medicamento slo en las cajas y no directamente en los tubos del [place].   Si su medicamento es muy caro, por favor, pngase en contacto con Zigmund Daniel llamando al [phone] y presione la opcin 4 o envenos un mensaje a travs de Pharmacist, community.   No podemos decirle cul ser su copago por los medicamentos por adelantado ya que esto es diferente dependiendo de la cobertura de su seguro. Sin embargo, es posible que podamos encontrar  un medicamento sustituto a Electrical engineer un formulario para que el seguro cubra el medicamento que se considera necesario.   Si se requiere una autorizacin previa para que su compaa de seguros [place] su medicamento, por favor permtanos de 1 a 2 das hbiles para completar este proceso.  Los precios de los medicamentos varan con frecuencia dependiendo del Environmental consultant de dnde se surte la receta y alguna farmacias pueden ofrecer precios ms baratos.  El sitio web www.goodrx.com tiene cupones para medicamentos de Airline pilot. Los precios aqu no tienen en cuenta lo que podra costar con la ayuda del seguro (puede ser ms barato con su seguro), pero el sitio web puede darle el precio si no utiliz Research scientist (physical sciences).  - Puede imprimir el cupn correspondiente y llevarlo con su receta a la farmacia.  - Tambin puede pasar por nuestra oficina durante el horario de atencin regular y Charity fundraiser una tarjeta de cupones de GoodRx.  - Si necesita que su receta se enve electrnicamente a una farmacia diferente, informe a nuestra oficina a travs de MyChart de [place] o por telfono llamando al [phone] y presione la opcin 4.

## 2022-05-26 NOTE — Progress Notes (Signed)
Follow-Up Visit   Subjective  Terri [name] is a 81 y.o. female who presents for the following: Annual Exam (1 year tbse, hx of aks, hx of seb derm using mometasone solution when needed for flares. Patient reports a bump at left eye area and some roughness at left lower leg she would like checked. ).  She would like a refill of triamcinolone cream which she uses as needed for bug bites  The patient presents for Total-Body Skin Exam (TBSE) for skin cancer screening and mole check.  The patient has spots, moles and lesions to be evaluated, some may be new or changing and the patient has concerns that these could be cancer.   The following portions of the chart were reviewed this encounter and updated as appropriate:      Review of Systems: No other skin or systemic complaints except as noted in HPI or Assessment and Plan.   Objective  Well appearing patient in no apparent distress; mood and affect are within normal limits.  A full examination was performed including scalp, head, eyes, ears, nose, lips, neck, chest, axillae, abdomen, back, buttocks, bilateral upper extremities, bilateral lower extremities, hands, feet, fingers, toes, fingernails, and toenails. All findings within normal limits unless otherwise noted below.  face Mid face erythema with telangiectasias +/- scattered inflammatory papules.   Scalp Scaliness at left occipital scalp   b/l legs and extremities Clear today   left lateral eyebrow 9 x 6 mm waxy flesh papule   left forearm x 1 Erythematous thin papules/macules with gritty scale.   right breast 0.6 cm pink/brown papule R lateral breast and 1 cm pink/brown papule R mid breast  b/l fingernails Pt has gel nails on, but c/o nail dryness, splitting, and brittleness   Assessment & Plan  Rosacea face  Chronic condition with duration or expected duration over one year. Currently well-controlled.   Rosacea is a chronic progressive skin condition usually  affecting the face of adults, causing redness and/or acne bumps. It is treatable but not curable. It sometimes affects the eyes (ocular rosacea) as well. It may respond to topical and/or systemic medication and can flare with stress, sun exposure, alcohol, exercise and some foods.  Daily application of broad spectrum spf 30+ sunscreen to face is recommended to reduce flares.   Continue Metronidazole 1% cream qhs     metroNIDAZOLE (METROGEL) 1 % gel - face Apply topically daily. Qhs to face for rosacea  Seborrheic dermatitis Scalp  Chronic condition with duration or expected duration over one year. Currently well-controlled.    -  is a chronic persistent rash characterized by pinkness and scaling most commonly of the mid face but also can occur on the scalp (dandruff), ears; mid chest, mid back and groin.  It tends to be exacerbated by stress and cooler weather.  People who have neurologic disease may experience new onset or exacerbation of existing seborrheic dermatitis.  The condition is not curable but treatable and can be controlled.   Cont Mometasone sol qd prn flares Cont Head and Shoulders shampoo    mometasone (ELOCON) 0.1 % lotion - Scalp Apply topically as directed. Qd to bid aa scalp prn flares  Bug bite without infection, initial encounter b/l legs and extremities  Clear today, she has h/o allergic reaction to bug bites   Cont TMC 0.1% cr qd/bid prn bites, avoid f/g/a  Refills sent to keep on hand and use when needed.   triamcinolone cream (KENALOG) 0.1 % - b/l legs  and extremities Apply topically 2 (two) times daily. Apply to bug bites QD-BID PRN. Avoid face, groin, and axilla.  Seborrheic keratosis left lateral eyebrow  Reassured benign age-related growth.  Recommend observation.  Discussed cryotherapy if spot(s) become irritated or inflamed.  Actinic keratosis left forearm x 1  Other aks at left mid eyebrow, nose, forehead and cheeks   Discussed Red light  therapy treatment for field treatment of Aks for entire face. Went over side effects and what to expect. Pamphlet given to patient as well as information on what to expect after treatment.   Patient would like treatment today.  Pt was part of training so no charge for procedure today.  Actinic keratoses are precancerous spots that appear secondary to cumulative UV radiation exposure/sun exposure over time. They are chronic with expected duration over 1 year. A portion of actinic keratoses will progress to squamous cell carcinoma of the skin. It is not possible to reliably predict which spots will progress to skin cancer and so treatment is recommended to prevent development of skin cancer.  Recommend daily broad spectrum sunscreen SPF 30+ to sun-exposed areas, reapply every 2 hours as needed.  Recommend staying in the shade or wearing long sleeves, sun glasses (UVA+UVB protection) and wide brim hats (4-inch brim around the entire circumference of the hat). Call for new or changing lesions.  Destruction of lesion - left forearm x 1  Destruction method: cryotherapy   Informed consent: discussed and consent obtained   Lesion destroyed using liquid nitrogen: Yes   Region frozen until ice ball extended beyond lesion: Yes   Outcome: patient tolerated procedure well with no complications   Post-procedure details: wound care instructions given   Additional details:  Prior to procedure, discussed risks of blister formation, small wound, skin dyspigmentation, or rare scar following cryotherapy. Recommend Vaseline ointment to treated areas while healing.   Congenital non-neoplastic nevus right breast  Benign-appearing.  Observation.  Call clinic for new or changing lesions.  Recommend daily use of broad spectrum spf 30+ sunscreen to sun-exposed areas.    Nail dystrophy b/l fingernails   Recommend Biotin 2.5 mg tab po qd  Avoid gel nails   Start Dermanail solution apply to nails bid qd.    Mild soap and moisturizer after hand washing, including to nails (rec. Cutemol cream)  Lentigines - Scattered tan macules - Due to sun exposure - Benign-appearing, observe - Recommend daily broad spectrum sunscreen SPF 30+ to sun-exposed areas, reapply every 2 hours as needed. - Call for any changes  Seborrheic Keratoses  - Stuck-on, waxy, tan-brown papules and/or plaques, including L lower leg - Benign-appearing - Discussed benign etiology and prognosis. - Observe - Call for any changes  Purpura - Chronic; persistent and recurrent.  Treatable, but not curable. - Violaceous macules and patches - Benign - Related to trauma, age, sun damage and/or use of blood thinners, chronic use of topical and/or oral steroids - Observe - Can use OTC arnica containing moisturizer such as Dermend Bruise Formula if desired - Call for worsening or other concerns  Melanocytic Nevi - Tan-brown and/or pink-flesh-colored symmetric macules and papules - Benign appearing on exam today - Observation - Call clinic for new or changing moles - Recommend daily use of broad spectrum spf 30+ sunscreen to sun-exposed areas.   Hemangiomas - Red papules - Discussed benign nature - Observe - Call for any changes  Actinic Damage with PreCancerous Actinic Keratoses Counseling for Topical Chemotherapy Management: Patient exhibits: - Severe, confluent actinic  changes with pre-cancerous actinic keratoses that is secondary to cumulative UV radiation exposure over time - Condition that is severe; chronic, not at goal. - diffuse scaly erythematous macules and papules with underlying dyspigmentation - Discussed Prescription "Field Treatment" topical Chemotherapy for Severe, Chronic Confluent Actinic Changes with Pre-Cancerous Actinic Keratoses Field treatment involves treatment of an entire area of skin that has confluent Actinic Changes (Sun/ Ultraviolet light damage) and PreCancerous Actinic Keratoses by  method of PhotoDynamic Therapy (PDT) and/or prescription Topical Chemotherapy agents such as 5-fluorouracil, 5-fluorouracil/calcipotriene, and/or imiquimod.  The purpose is to decrease the number of clinically evident and subclinical PreCancerous lesions to prevent progression to development of skin cancer by chemically destroying early precancer changes that may or may not be visible.  It has been shown to reduce the risk of developing skin cancer in the treated area. As a result of treatment, redness, scaling, crusting, and open sores may occur during treatment course. One or more than one of these methods may be used and may have to be used several times to control, suppress and eliminate the PreCancerous changes. Discussed treatment course, expected reaction, and possible side effects. - Recommend daily broad spectrum sunscreen SPF 30+ to sun-exposed areas, reapply every 2 hours as needed.  - Staying in the shade or wearing long sleeves, sun glasses (UVA+UVB protection) and wide brim hats (4-inch brim around the entire circumference of the hat) are also recommended. - Call for new or changing lesions.  - Will schedule Red light therapy to the face   Skin cancer screening performed today. Return in about 9 weeks (around 07/28/2022) for red light/ ak follow up , 1 year tbse . I, Ruthell Rummage, CMA, am acting as scribe for Brendolyn Patty, MD.  Documentation: I have reviewed the above documentation for accuracy and completeness, and I agree with the above.  Brendolyn Patty MD

## 2022-05-26 NOTE — Progress Notes (Signed)
Patient completed redlight training therapy today.  1. AK (actinic keratosis) Head - Anterior (Face)  Photodynamic therapy - Head - Anterior (Face) Procedure discussed: discussed risks, benefits, side effects. and alternatives   Prep: site scrubbed/prepped with acetone   Debridement needed: Yes   Location:  Face Number of lesions:  Multiple Type of treatment:  Red light Aminolevulinic Acid (see MAR for details): Ameluz Amount of Ameluz (mg):  1 Incubation time (minutes):  60 Number of minutes under lamp:  20 Cooling:  Floor fan Outcome: patient tolerated procedure well with no complications   Post-procedure details: sunscreen applied    Johnsie Kindred, RMA  Documentation: I have reviewed the above documentation for accuracy and completeness, and I agree with the above.  Brendolyn Patty MD

## 2022-05-28 ENCOUNTER — Telehealth: Payer: Self-pay

## 2022-05-28 NOTE — Telephone Encounter (Signed)
This patient came in today for pictures post red light PDT treatment on Tuesday. They are under the media tab for your review.

## 2022-07-28 ENCOUNTER — Other Ambulatory Visit: Payer: Self-pay | Admitting: Obstetrics and Gynecology

## 2022-07-28 DIAGNOSIS — Z1231 Encounter for screening mammogram for malignant neoplasm of breast: Secondary | ICD-10-CM

## 2022-08-04 ENCOUNTER — Ambulatory Visit: Payer: Medicare HMO | Admitting: Dermatology

## 2022-08-04 VITALS — BP 115/62

## 2022-08-04 DIAGNOSIS — L821 Other seborrheic keratosis: Secondary | ICD-10-CM | POA: Diagnosis not present

## 2022-08-04 DIAGNOSIS — L570 Actinic keratosis: Secondary | ICD-10-CM | POA: Diagnosis not present

## 2022-08-04 DIAGNOSIS — L719 Rosacea, unspecified: Secondary | ICD-10-CM | POA: Diagnosis not present

## 2022-08-04 DIAGNOSIS — L578 Other skin changes due to chronic exposure to nonionizing radiation: Secondary | ICD-10-CM

## 2022-08-04 NOTE — Patient Instructions (Addendum)
Rosacea  What is rosacea? Rosacea (say: ro-zay-sha) is a common skin disease that usually begins as a trend of flushing or blushing easily.  As rosacea progresses, a persistent redness in the center of the face will develop and may gradually spread beyond the nose and cheeks to the forehead and chin.  In some cases, the ears, chest, and back could be affected.  Rosacea may appear as tiny blood vessels or small red bumps that occur in crops.  Frequently they can contain pus, and are called "pustules".  If the bumps do not contain pus, they are referred to as "papules".  Rarely, in prolonged, untreated cases of rosacea, the oil glands of the nose and cheeks may become permanently enlarged.  This is called rhinophyma, and is seen more frequently in men.  Signs and Risks In its beginning stages, rosacea tends to come and go, which makes it difficult to recognize.  It can start as intermittent flushing of the face.  Eventually, blood vessels may become permanently visible.  Pustules and papules can appear, but can be mistaken for adult acne.  People of all races, ages, genders and ethnic groups are at risk of developing rosacea.  However, it is more common in women (especially around menopause) and adults with fair skin between the ages of 3 and 87.  Treatment Dermatologists typically recommend a combination of treatments to effectively manage rosacea.  Treatment can improve symptoms and may stop the progression of the rosacea.  Treatment may involve both topical and oral medications.  The tetracycline antibiotics are often used for their anti-inflammatory effect; however, because of the possibility of developing antibiotic resistance, they should not be used long term at full dose.  For dilated blood vessels the options include electrodessication (uses electric current through a small needle), laser treatment, and cosmetics to hide the redness.   With all forms of treatment, improvement is a slow process, and  patients may not see any results for the first 3-4 weeks.  It is very important to avoid the sun and other triggers.  Patients must wear sunscreen daily.  Skin Care Instructions: Cleanse the skin with a mild soap such as CeraVe cleanser, Cetaphil cleanser, or Dove soap once or twice daily as needed. Moisturize with Eucerin Redness Relief Daily Perfecting Lotion (has a subtle green tint), CeraVe Moisturizing Cream, or Oil of Olay Daily Moisturizer with sunscreen every morning and/or night as recommended. Makeup should be "non-comedogenic" (won't clog pores) and be labeled "for sensitive skin". Good choices for cosmetics are: Neutrogena, Almay, and Physician's Formula.  Any product with a green tint tends to offset a red complexion. If your eyes are dry and irritated, use artificial tears 2-3 times per day and cleanse the eyelids daily with baby shampoo.  Have your eyes examined at least every 2 years.  Be sure to tell your eye doctor that you have rosacea. Alcoholic beverages tend to cause flushing of the skin, and may make rosacea worse. Always wear sunscreen, protect your skin from extreme hot and cold temperatures, and avoid spicy foods, hot drinks, and mechanical irritation such as rubbing, scrubbing, or massaging the face.  Avoid harsh skin cleansers, cleansing masks, astringents, and exfoliation. If a particular product burns or makes your face feel tight, then it is likely to flare your rosacea. If you are having difficulty finding a sunscreen that you can tolerate, you may try switching to a chemical-free sunscreen.  These are ones whose active ingredient is zinc oxide or titanium dioxide  only.  They should also be fragrance free, non-comedogenic, and labeled for sensitive skin. Rosacea triggers may vary from person to person.  There are a variety of foods that have been reported to trigger rosacea.  Some patients find that keeping a diary of what they were doing when they flared helps them avoid  triggers.    Due to recent changes in healthcare laws, you may see results of your pathology and/or laboratory studies on MyChart before the doctors have had a chance to review them. We understand that in some cases there may be results that are confusing or concerning to you. Please understand that not all results are received at the same time and often the doctors may need to interpret multiple results in order to provide you with the best plan of care or course of treatment. Therefore, we ask that you please give [place] 2 business days to thoroughly review all your results before contacting the office for clarification. Should we see a critical lab result, you will be contacted sooner.   If You Need Anything After Your Visit  If you have any questions or concerns for your doctor, please call our main line at [phone] and press option 4 to reach your doctor's medical assistant. If no one answers, please leave a voicemail as directed and we will return your call as soon as possible. Messages left after 4 pm will be answered the following business day.   You may also send [place] a message via [place]. We typically respond to MyChart messages within 1-2 business days.  For prescription refills, please ask your pharmacy to contact our office. Our fax number is [phone].  If you have an urgent issue when the clinic is closed that cannot wait until the next business day, you can page your doctor at the number below.    Please note that while we do our best to be available for urgent issues outside of office hours, we are not available 24/7.   If you have an urgent issue and are unable to reach [place], you may choose to seek medical care at your doctor's office, retail clinic, urgent care center, or emergency room.  If you have a medical emergency, please immediately call 911 or go to the emergency department.  Pager Numbers  - Dr. Nehemiah Massed: [phone]  - Dr. Laurence Ferrari: [phone]  - Dr. Nicole Kindred:  [phone]  In the event of inclement weather, please call our main line at [phone] for an update on the status of any delays or closures.  Dermatology Medication Tips: Please keep the boxes that topical medications come in in order to help keep track of the instructions about where and how to use these. Pharmacies typically print the medication instructions only on the boxes and not directly on the medication tubes.   If your medication is too expensive, please contact our office at [phone] option 4 or send [place] a message through [place].   We are unable to tell what your co-pay for medications will be in advance as this is different depending on your insurance coverage. However, we may be able to find a substitute medication at lower cost or fill out paperwork to get insurance to cover a needed medication.   If a prior authorization is required to get your medication covered by your insurance company, please allow [place] 1-2 business days to complete this process.  Drug prices often vary depending on where the prescription is filled and some pharmacies may offer cheaper prices.  The website www.goodrx.com contains coupons for medications through different pharmacies. The prices here do not account for what the cost may be with help from insurance (it may be cheaper with your insurance), but the website can give you the price if you did not use any insurance.  - You can print the associated coupon and take it with your prescription to the pharmacy.  - You may also stop by our office during regular business hours and pick up a GoodRx coupon card.  - If you need your prescription sent electronically to a different pharmacy, notify our office through [facility] or by phone at [phone] option 4.     Si Usted Necesita Algo Despus de Su Visita  Tambin puede enviarnos un mensaje a travs de Pharmacist, community. Por lo general respondemos a los mensajes de MyChart en el transcurso de 1 a 2  das hbiles.  Para renovar recetas, por favor pida a su farmacia que se ponga en contacto con nuestra oficina. Harland Dingwall de fax es [place] [phone].  Si tiene un asunto urgente cuando la clnica est cerrada y que no puede esperar hasta el siguiente da hbil, puede llamar/localizar a su doctor(a) al nmero que aparece a continuacin.   Por favor, tenga en cuenta que aunque hacemos todo lo posible para estar disponibles para asuntos urgentes fuera del horario de [place], no estamos disponibles las 24 horas del da, los 7 das de la [place].   Si tiene un problema urgente y no puede comunicarse con nosotros, puede optar por buscar atencin mdica  en el consultorio de su doctor(a), en una clnica privada, en un centro de atencin urgente o en una sala de emergencias.  Si tiene Engineering geologist, por favor llame inmediatamente al 911 o vaya a la sala de emergencias.  Nmeros de bper  - Dr. Nehemiah Massed: [phone]  - Dra. Moye: [phone]  - Dra. Nicole Kindred: [phone]  En caso de inclemencias del [place], por favor llame a Johnsie Kindred principal al [phone] para una actualizacin sobre el [place] de cualquier retraso o cierre.  Consejos para la medicacin en dermatologa: Por favor, guarde las cajas en las que vienen los medicamentos de uso tpico para ayudarle a seguir las instrucciones sobre dnde y cmo usarlos. Las farmacias generalmente imprimen las instrucciones del medicamento slo en las cajas y no directamente en los tubos del [place].   Si su medicamento es muy caro, por favor, pngase en contacto con Zigmund Daniel llamando al [phone] y presione la opcin 4 o envenos un mensaje a travs de Pharmacist, community.   No podemos decirle cul ser su copago por los medicamentos por adelantado ya que esto es diferente dependiendo de la cobertura de su seguro. Sin embargo, es posible que podamos encontrar un medicamento sustituto a Electrical engineer un formulario para que el  seguro cubra el medicamento que se considera necesario.   Si se requiere una autorizacin previa para que su compaa de seguros [place] su medicamento, por favor permtanos de 1 a 2 das hbiles para completar este proceso.  Los precios de los medicamentos varan con frecuencia dependiendo del Environmental consultant de dnde se surte la receta y alguna farmacias pueden ofrecer precios ms baratos.  El sitio web www.goodrx.com tiene cupones para medicamentos de Airline pilot. Los precios aqu no tienen en cuenta lo que podra costar con la ayuda del seguro (puede ser ms barato con su seguro), pero el sitio web puede darle el precio si no utiliz Research scientist (physical sciences).  -  imprimir el cupn correspondiente y llevarlo con su receta a la farmacia.  - Tambin puede pasar por nuestra oficina durante el horario de atencin regular y recoger una tarjeta de cupones de GoodRx.  - Si necesita que su receta se enve electrnicamente a una farmacia diferente, informe a nuestra oficina a travs de MyChart de [place] o por telfono llamando al [phone] y presione la opcin 4.  

## 2022-08-04 NOTE — Progress Notes (Signed)
Follow-Up Visit   Subjective  Terri [name] is a 82 y.o. female who presents for the following: Follow-up.  Patient presents for 2 month follow-up Aks and Red light therapy. She has a good reaction with pinkness and peeling.   The following portions of the chart were reviewed this encounter and updated as appropriate:       Review of Systems:  No other skin or systemic complaints except as noted in HPI or Assessment and Plan.  Objective  Well appearing patient in no apparent distress; mood and affect are within normal limits.  A focused examination was performed including face. Relevant physical exam findings are noted in the Assessment and Plan.  face Mild erythema of the malar cheeks, no scale.  face Telangiectasias and erythema of the malar cheeks    Assessment & Plan  Actinic Damage - chronic, secondary to cumulative UV radiation exposure/sun exposure over time - diffuse scaly erythematous macules with underlying dyspigmentation - Recommend daily broad spectrum sunscreen SPF 30+ to sun-exposed areas, reapply every 2 hours as needed.  - Recommend staying in the shade or wearing long sleeves, sun glasses (UVA+UVB protection) and wide brim hats (4-inch brim around the entire circumference of the hat). - Call for new or changing lesions.  AK (actinic keratosis) face  Good results with Red light therapy with debridement. Face clear today.   Actinic keratoses are precancerous spots that appear secondary to cumulative UV radiation exposure/sun exposure over time. They are chronic with expected duration over 1 year. A portion of actinic keratoses will progress to squamous cell carcinoma of the skin. It is not possible to reliably predict which spots will progress to skin cancer and so treatment is recommended to prevent development of skin cancer.  Recommend daily broad spectrum sunscreen SPF 30+ to sun-exposed areas, reapply every 2 hours as needed.  Recommend staying in  the shade or wearing long sleeves, sun glasses (UVA+UVB protection) and wide brim hats (4-inch brim around the entire circumference of the hat). Call for new or changing lesions.  Rosacea face  Chronic condition with duration or expected duration over one year. Currently well-controlled.   Rosacea is a chronic progressive skin condition usually affecting the face of adults, causing redness and/or acne bumps. It is treatable but not curable. It sometimes affects the eyes (ocular rosacea) as well. It may respond to topical and/or systemic medication and can flare with stress, sun exposure, alcohol, exercise, topical steroids (including hydrocortisone/cortisone 10) and some foods.  Daily application of broad spectrum spf 30+ sunscreen to face is recommended to reduce flares.  Continue metronidazole 1% gel QHS face.  Discussed BBL treatment, $350/tx.  Counseling for BBL / IPL / Laser and Coordination of Care Discussed the treatment option of Broad Band Light (BBL) Intense Pulsed Light (IPL) / Laser.  Typically we recommend at least 1-3 treatment sessions about 5-8 weeks apart for best results.  The patient's condition may require "maintenance treatments" in the future.  The fee for BBL / laser treatments is $350 per treatment session for the whole face.  A fee can be quoted for other parts of the body. Insurance typically does not pay for BBL/laser treatments and therefore the fee is an out-of-pocket cost.   Related Medications metroNIDAZOLE (METROGEL) 1 % gel Apply topically daily. Qhs to face for rosacea  Seborrheic Keratoses - Stuck-on, waxy, tan-brown macule of the left eyebrow - Benign-appearing - Discussed benign etiology and prognosis. - Observe - Call for any changes  Return  as scheduled, for TBSE, Hx AKs.  IJamesetta Orleans, CMA, am acting as scribe for Brendolyn Patty, MD .  Documentation: I have reviewed the above documentation for accuracy and completeness, and I agree with the  above.  Brendolyn Patty MD

## 2022-09-15 ENCOUNTER — Ambulatory Visit
Admission: RE | Admit: 2022-09-15 | Discharge: 2022-09-15 | Disposition: A | Discharge status: Home / Self Care | Payer: Medicare HMO | Source: Ambulatory Visit | Attending: Obstetrics and Gynecology | Admitting: Obstetrics and Gynecology

## 2022-09-15 DIAGNOSIS — Z1231 Encounter for screening mammogram for malignant neoplasm of breast: Secondary | ICD-10-CM | POA: Diagnosis present

## 2022-09-22 ENCOUNTER — Other Ambulatory Visit: Payer: Self-pay | Admitting: Neurology

## 2022-09-22 DIAGNOSIS — R519 Headache, unspecified: Secondary | ICD-10-CM

## 2022-09-24 ENCOUNTER — Ambulatory Visit
Admission: RE | Admit: 2022-09-24 | Discharge: 2022-09-24 | Disposition: A | Discharge status: Home / Self Care | Payer: Medicare HMO | Source: Ambulatory Visit | Attending: Neurology | Admitting: Neurology

## 2022-09-24 DIAGNOSIS — R519 Headache, unspecified: Secondary | ICD-10-CM | POA: Insufficient documentation

## 2022-09-24 MED ORDER — GADOBUTROL 1 MMOL/ML IV SOLN
6.0000 mL | Freq: Once | INTRAVENOUS | Status: AC | PRN
  Administered 2022-09-24: 6 mL via INTRAVENOUS

## 2022-10-06 ENCOUNTER — Other Ambulatory Visit: Payer: Medicare HMO

## 2023-05-16 DIAGNOSIS — M1712 Unilateral primary osteoarthritis, left knee: Principal | ICD-10-CM | POA: Insufficient documentation

## 2023-06-08 ENCOUNTER — Encounter: Payer: Medicare HMO | Admitting: Dermatology

## 2023-06-10 ENCOUNTER — Encounter: Payer: Medicare HMO | Admitting: Dermatology

## 2023-07-18 NOTE — Discharge Instructions (Signed)

## 2023-07-26 ENCOUNTER — Other Ambulatory Visit: Payer: Self-pay

## 2023-07-26 ENCOUNTER — Encounter
Admission: RE | Admit: 2023-07-26 | Discharge: 2023-07-26 | Disposition: A | Discharge status: Home / Self Care | Payer: Medicare HMO | Source: Ambulatory Visit | Attending: Orthopedic Surgery | Admitting: Orthopedic Surgery

## 2023-07-26 VITALS — BP 115/55 | HR 88 | Resp 20 | Ht 63.0 in | Wt 128.0 lb

## 2023-07-26 DIAGNOSIS — R3129 Other microscopic hematuria: Secondary | ICD-10-CM | POA: Insufficient documentation

## 2023-07-26 DIAGNOSIS — Q630 Accessory kidney: Secondary | ICD-10-CM | POA: Diagnosis not present

## 2023-07-26 DIAGNOSIS — I498 Other specified cardiac arrhythmias: Secondary | ICD-10-CM | POA: Insufficient documentation

## 2023-07-26 DIAGNOSIS — Z01818 Encounter for other preprocedural examination: Secondary | ICD-10-CM | POA: Diagnosis present

## 2023-07-26 DIAGNOSIS — M1712 Unilateral primary osteoarthritis, left knee: Secondary | ICD-10-CM | POA: Insufficient documentation

## 2023-07-26 HISTORY — DX: Other reaction to spinal and lumbar puncture: G97.1

## 2023-07-26 LAB — URINALYSIS, ROUTINE W REFLEX MICROSCOPIC
Bacteria, UA: NONE SEEN
Bilirubin Urine: NEGATIVE
Glucose, UA: NEGATIVE mg/dL
Ketones, ur: NEGATIVE mg/dL
Nitrite: NEGATIVE
Protein, ur: NEGATIVE mg/dL
Specific Gravity, Urine: 1.006 (ref 1.005–1.030)
pH: 7 (ref 5.0–8.0)

## 2023-07-26 LAB — SURGICAL PCR SCREEN
MRSA, PCR: NEGATIVE
Staphylococcus aureus: NEGATIVE

## 2023-07-26 LAB — CBC
HCT: 40 % (ref 36.0–46.0)
Hemoglobin: 13.2 g/dL (ref 12.0–15.0)
MCH: 30.8 pg (ref 26.0–34.0)
MCHC: 33 g/dL (ref 30.0–36.0)
MCV: 93.2 fL (ref 80.0–100.0)
Platelets: 278 10*3/uL (ref 150–400)
RBC: 4.29 MIL/uL (ref 3.87–5.11)
RDW: 12.3 % (ref 11.5–15.5)
WBC: 5.1 10*3/uL (ref 4.0–10.5)
nRBC: 0 % (ref 0.0–0.2)

## 2023-07-26 LAB — COMPREHENSIVE METABOLIC PANEL
ALT: 14 U/L (ref 0–44)
AST: 20 U/L (ref 15–41)
Albumin: 4.1 g/dL (ref 3.5–5.0)
Alkaline Phosphatase: 49 U/L (ref 38–126)
Anion gap: 11 (ref 5–15)
BUN: 19 mg/dL (ref 8–23)
CO2: 27 mmol/L (ref 22–32)
Calcium: 9.1 mg/dL (ref 8.9–10.3)
Chloride: 101 mmol/L (ref 98–111)
Creatinine, Ser: 0.64 mg/dL (ref 0.44–1.00)
GFR, Estimated: >60 mL/min (ref >60)
Glucose, Bld: 96 mg/dL (ref 70–99)
Potassium: 4.1 mmol/L (ref 3.5–5.1)
Sodium: 139 mmol/L (ref 135–145)
Total Bilirubin: 0.8 mg/dL (ref 0.0–1.2)
Total Protein: 7.1 g/dL (ref 6.5–8.1)

## 2023-07-26 LAB — C-REACTIVE PROTEIN: CRP: <0.5 mg/dL (ref <1.0)

## 2023-07-26 LAB — SEDIMENTATION RATE: Sed Rate: 25 mm/h (ref 0–30)

## 2023-07-26 NOTE — Patient Instructions (Addendum)
 Your procedure is scheduled on: Monday 08/02/23 To find out your arrival time, please call [phone] between 1PM - 3PM on:   Friday 07/30/23 Report to the Registration Desk on the 1st floor of the [facility]. FREE Valet parking is available.  If your arrival time is 6:00 am, do not arrive before that time as the [facility] entrance doors do not open until 6:00 am.  REMEMBER: Instructions that are not followed completely may result in serious medical risk, up to and including death; or upon the discretion of your surgeon and anesthesiologist your surgery may need to be rescheduled.  Do not eat food after midnight the night before surgery.  No gum chewing or hard candies.  You may however, drink CLEAR liquids up to 2 hours before you are scheduled to arrive for your surgery. Do not drink anything within 2 hours of your scheduled arrival time.  Clear liquids include: - water  - apple juice without pulp - gatorade (not RED colors) - black coffee or tea (Do NOT add milk or creamers to the coffee or tea) Do NOT drink anything that is not on this list.  Type 1 and Type 2 diabetics should only drink water.  In addition, your doctor has ordered for you to drink the provided:  Ensure Pre-Surgery Clear Carbohydrate Drink  Drinking this carbohydrate drink up to two hours before surgery helps to reduce insulin resistance and improve patient outcomes. Please complete drinking 2 hours before scheduled arrival time.  One week prior to surgery: Stop Anti-inflammatories (NSAIDS) such as Advil, Aleve, Ibuprofen, Motrin, Naproxen, Naprosyn and Aspirin  based products such as Excedrin, Goody's Powder, BC Powder. Stop Meloxicam  today You may however, continue to take Tylenol  if needed for pain up until the day of surgery.  Stop ANY OVER THE COUNTER supplements and vitamins for 7 days until after surgery.  Continue taking all prescribed medications.   TAKE ONLY THESE MEDICATIONS THE MORNING OF  SURGERY WITH A SIP OF WATER:  None unless you are asked to arrive in the afternoon (amlodipine  and simvastatin )  No Alcohol for 24 hours before or after surgery.  No Smoking including e-cigarettes for 24 hours before surgery.  No chewable tobacco products for at least 6 hours before surgery.  No nicotine patches on the day of surgery.  Do not use any recreational drugs for at least a week (preferably 2 weeks) before your surgery.  Please be advised that the combination of cocaine and anesthesia may have negative outcomes, up to and including death. If you test positive for cocaine, your surgery will be cancelled.  On the morning of surgery brush your teeth with toothpaste and water, you may rinse your mouth with mouthwash if you wish. Do not swallow any toothpaste or mouthwash.  Use CHG Soap or wipes as directed on instruction sheet.  Do not wear lotions, powders, or perfumes on the day of your surgery.  Do not shave body hair from the neck down 48 hours before surgery.  Wear clean comfortable clothing (specific to your surgery type) to the hospital.  Do not wear jewelry, make-up, hairpins, clips or nail polish.  For welded (permanent) jewelry: bracelets, anklets, waist bands, etc.  Please have this removed prior to surgery.  If it is not removed, there is a chance that hospital personnel will need to cut it off on the day of surgery. Contact lenses, hearing aids and dentures may not be worn into surgery.  Do not bring valuables to the  hospital. [facility] is not responsible for any missing/lost belongings or valuables.   Notify your doctor if there is any change in your medical condition (cold, fever, infection).  If you are being discharged the day of surgery, you will not be allowed to drive home. You will need a responsible individual to drive you home and stay with you for 24 hours after surgery.   If you are taking public transportation, you will need to have a  responsible individual with you.  If you are being admitted to the hospital overnight, leave your suitcase in the car. After surgery it may be brought to your room.  In case of increased patient census, it may be necessary for you, the patient, to continue your postoperative care in the Same Day Surgery department.  After surgery, you can help prevent lung complications by doing breathing exercises.  Take deep breaths and cough every 1-2 hours. Your doctor may order a device called an Incentive Spirometer to help you take deep breaths. When coughing or sneezing, hold a pillow firmly against your incision with both hands. This is called "splinting." Doing this helps protect your incision. It also decreases belly discomfort.  Surgery Visitation Policy:  Patients undergoing a surgery or procedure may have two family members or support persons with them as long as the person is not COVID-19 positive or experiencing its symptoms.   Inpatient Visitation:    Visiting hours are 7 a.m. to 8 p.m. Up to four visitors are allowed at one time in a patient room. The visitors may rotate out with other people during the day. One designated support person (adult) may remain overnight.  Please call the Pre-admissions Testing Dept. at [phone] if you have any questions about these instructions.    Pre-operative 5 CHG Bath Instructions   You can play a key role in reducing the risk of infection after surgery. Your skin needs to be as free of germs as possible. You can reduce the number of germs on your skin by washing with CHG (chlorhexidine  gluconate) soap before surgery. CHG is an antiseptic soap that kills germs and continues to kill germs even after washing.   DO NOT use if you have an allergy to chlorhexidine /CHG or antibacterial soaps. If your skin becomes reddened or irritated, stop using the CHG and notify one of our RNs at [phone].   Please shower with the CHG soap starting 4 days before  surgery using the following schedule:   Thursday 07/29/23 - Monday 08/02/23    Please keep in mind the following:  DO NOT shave, including legs and underarms, starting the day of your first shower.   You may shave your face at any point before/day of surgery.  Place clean sheets on your bed the day you start using CHG soap. Use a clean washcloth (not used since being washed) for each shower. DO NOT sleep with pets once you start using the CHG.   CHG Shower Instructions:  If you choose to wash your hair and private area, wash first with your normal shampoo/soap.  After you use shampoo/soap, rinse your hair and body thoroughly to remove shampoo/soap residue.  Turn the water OFF and apply about 3 tablespoons (45 ml) of CHG soap to a CLEAN washcloth.  Apply CHG soap ONLY FROM YOUR NECK DOWN TO YOUR TOES (washing for 3-5 minutes)  DO NOT use CHG soap on face, private areas, open wounds, or sores.  Pay special attention to the area where your  surgery is being performed.  If you are having back surgery, having someone wash your back for you may be helpful. Wait 2 minutes after CHG soap is applied, then you may rinse off the CHG soap.  Pat dry with a clean towel  Put on clean clothes/pajamas   If you choose to wear lotion, please use ONLY the CHG-compatible lotions on the back of this paper.     Additional instructions for the day of surgery: DO NOT APPLY any lotions, deodorants, cologne, or perfumes.   Put on clean/comfortable clothes.  Brush your teeth.  Ask your nurse before applying any prescription medications to the skin.      CHG Compatible Lotions   Aveeno Moisturizing lotion  Cetaphil Moisturizing Cream  Cetaphil Moisturizing Lotion  Clairol Herbal Essence Moisturizing Lotion, Dry Skin  Clairol Herbal Essence Moisturizing Lotion, Extra Dry Skin  Clairol Herbal Essence Moisturizing Lotion, Normal Skin  Curel Age Defying Therapeutic Moisturizing Lotion with Alpha Hydroxy   Curel Extreme Care Body Lotion  Curel Soothing Hands Moisturizing Hand Lotion  Curel Therapeutic Moisturizing Cream, Fragrance-Free  Curel Therapeutic Moisturizing Lotion, Fragrance-Free  Curel Therapeutic Moisturizing Lotion, Original Formula  Eucerin Daily Replenishing Lotion  Eucerin Dry Skin Therapy Plus Alpha Hydroxy Crme  Eucerin Dry Skin Therapy Plus Alpha Hydroxy Lotion  Eucerin Original Crme  Eucerin Original Lotion  Eucerin Plus Crme Eucerin Plus Lotion  Eucerin TriLipid Replenishing Lotion  Keri Anti-Bacterial Hand Lotion  Keri Deep Conditioning Original Lotion Dry Skin Formula Softly Scented  Keri Deep Conditioning Original Lotion, Fragrance Free Sensitive Skin Formula  Keri Lotion Fast Absorbing Fragrance Free Sensitive Skin Formula  Keri Lotion Fast Absorbing Softly Scented Dry Skin Formula  Keri Original Lotion  Keri Skin Renewal Lotion Keri Silky Smooth Lotion  Keri Silky Smooth Sensitive Skin Lotion  Nivea Body Creamy Conditioning Oil  Nivea Body Extra Enriched Teacher, Adult Education Moisturizing Lotion Nivea Crme  Nivea Skin Firming Lotion  NutraDerm 30 Skin Lotion  NutraDerm Skin Lotion  NutraDerm Therapeutic Skin Cream  NutraDerm Therapeutic Skin Lotion  ProShield Protective Hand Cream  Provon moisturizing lotion

## 2023-07-31 ENCOUNTER — Encounter: Payer: Self-pay | Admitting: Orthopedic Surgery

## 2023-07-31 NOTE — H&P (Signed)
 ORTHOPAEDIC HISTORY & PHYSICAL [place]ell Thom Ade, PA - 07/26/2023 2:45 PM EST Formatting of this note is different from the original. Images from the original note were not included. Chief Complaint Chief Complaint Patient presents with Knee Pain H & P LEFT KNEE  Reason for Visit Terri [name] is a 83 y.o. who presents today for a history and physical. She is to undergo a left total knee arthroplasty on 08/02/2023. Since her last visit here to the clinic she has had no change in her condition. The patient expresses her desire to proceed with surgery.  She has a long history of left knee pain and stiffness. She localizes most of the pain along the medial aspect of the knee. She reports some swelling, no locking, and no giving way of the knee. The pain is aggravated by any weight bearing. The knee pain limits the patient's ability to ambulate long distances. The patient has not appreciated any significant improvement despite Tylenol , NSAIDs, activity modification, and intraarticular corticosteroid injections. She is not using any ambulatory aids. The patient states that the knee pain has progressed to the point that it is significantly interfering with her activities of daily living.  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 Renal ultrasound in12/2015 Seasonal allergies Vitamin D  deficiency  Past Surgical History Past Surgical History: Procedure Laterality Date vein surgery 1988 DILATION AND CURETTAGE, DIAGNOSTIC / THERAPEUTIC 1990 COLONOSCOPY 06/2012 5 yrs COLONOSCOPY 09/17/2017 Tubular adenoma of the colon/Hyperplastic colon polyp/Repeat 89yrs/MUS REPLACEMENT TOTAL KNEE Right 01/2021 CATARACT EXTRACTION Left  Past  Family History Family History Problem Relation Age of Onset Pancreatic cancer Mother Hyperlipidemia (Elevated cholesterol) Father High blood pressure (Hypertension) Father Diabetes Father Diabetes type II Father Prostate cancer Father High blood pressure (Hypertension) Sister Crohn's disease Sister Stroke Brother after covid vaccine Skin cancer Brother Hyperlipidemia (Elevated cholesterol) Brother Crohn's disease Brother Hyperlipidemia (Elevated cholesterol) Daughter  Medications Current Outpatient Medications Medication Sig Dispense Refill acetaminophen  (TYLENOL ) 500 MG tablet Take 1,000 mg by mouth every 6 (six) hours as needed amLODIPine  (NORVASC ) 5 MG tablet Take 1 tablet (5 mg total) by mouth once daily 90 tablet 3 amoxicillin (AMOXIL) 500 MG tablet Take 4 tablets by mouth 1 HOUR BEFORE DENTAL APPOINTMENT aspirin  81 MG chewable tablet Take 81 mg by mouth once a week biotin 5 mg Tab Take 5,000 mcg by mouth once daily calcium  carbonate (TUMS) 200 mg calcium  (500 mg) chewable tablet Take 2 tablets by mouth 2 (two) times daily as needed cyanocobalamin  (VITAMIN B12) 1000 MCG tablet Take 1,000 mcg by mouth once daily. ergocalciferol , vitamin D2, 1,250 mcg (50,000 unit) capsule Take 1 capsule (50,000 Units total) by mouth once a week 12 capsule 3 fluoride, sodium, (PREVIDENT 5000) 1.1 % dental paste Place onto teeth once daily magnesium  gluconate (MAGONATE) 27.5 mg magne- sium (500 mg) tablet Take 500 mg by mouth every other day meclizine  (ANTIVERT ) 25 mg tablet Take 1 tablet (25 mg total) by mouth 3 (three) times daily as needed for Dizziness 20 tablet 0 meloxicam  (MOBIC ) 7.5 MG tablet Take 1 tablet (7.5 mg total) by mouth once daily as needed 90 tablet 3 metroNIDAZOLE  (METROGEL ) 1 % gel Apply topically as needed simvastatin  (ZOCOR ) 20 MG tablet Take 1 tablet (20 mg total) by mouth at bedtime 90 tablet 3 triamcinolone  0.1 % cream Apply topically as  needed denosumab (PROLIA) 60  mg/mL inj syringe Inject 1 mL (60 mg total) subcutaneously once for 1 dose 1 mL 1  No current facility-administered medications for this visit.  Allergies Allergies Allergen Reactions Sulfa (Sulfonamide Antibiotics) Hives and Rash   Review of Systems A comprehensive 14 point ROS was performed, reviewed, and the pertinent orthopaedic findings are documented in the HPI.  Exam BP 130/70 (BP Location: Left upper arm, Patient Position: Sitting, BP Cuff Size: Adult)  Ht 160 cm (5' 3)  Wt 58.5 kg (129 lb)  BMI 22.85 kg/m  General: Well-developed well-nourished female seen in no acute distress.  HEENT: Atraumatic,normocephalic. Pupils are equal and reactive to light. Oropharynx is clear with moist mucosa  Lungs: Clear to auscultation bilaterally  Cardiovascular: Regular rate and rhythm, but does have an occasional irregular right. Patient is aware of this.. Normal S1, S2. No murmurs. No appreciable gallops or rubs. Peripheral pulses are palpable.  Abdomen: Soft, non-tender, nondistended. Bowel sounds present  Extremity:  Left Knee: Soft tissue swelling: minimal Effusion: none Erythema: none Crepitance: mild Tenderness: medial Alignment: relative varus Mediolateral laxity: medial pseudolaxity Posterior sag: negative Patellar tracking: Good tracking without evidence of subluxation or tilt Atrophy: No significant atrophy. Quadriceps tone was fair to good. Range of motion: 0/0/124 degrees  Neurological:  The patient is alert and oriented Sensation to light touch appears to be intact and within normal limits Gross motor strength appeared to be equal to 5/5  Vascular :  Peripheral pulses felt to be palpable. Capillary refill appears to be intact and within normal limits  X-ray  1. AP standing, lateral and sunrise view of the left knee ordered and interpreted on today's visit shows tricompartmental degenerative arthrosis. She is noted to be bone-on-bone to the medial  compartment space. She is noted to have osteophytes in a tricompartmental fashion. Subchondral sclerosis noted to the medial t tibial plateau and femoral condyle. She is noted to have flattening of the medial and lateral femoral condyles. The patella is tracking well but is noted to have significant osteophyte formation to the lateral pole of the patella.  Impression  1. Degenerative arthrosis left knee  Plan  1. Have gone over patient's medication on today's visit 2. Past medical history reviewed 3. Return to clinic postop as scheduled. Sooner if any problems 4. Discussed rehab course  This note was generated in part with voice recognition software and I apologize for any typographical errors that were not detected and corrected   Thom JONELLE Geralds PA Electronically signed by Geralds Thom Ade, PA at 07/26/2023 4:03 PM EST

## 2023-08-02 ENCOUNTER — Other Ambulatory Visit: Payer: Self-pay

## 2023-08-02 ENCOUNTER — Ambulatory Visit: Payer: Medicare HMO | Admitting: Urgent Care

## 2023-08-02 ENCOUNTER — Encounter
Admission: RE | Disposition: A | Discharge status: Home / Self Care | Payer: Self-pay | Source: Home / Self Care | Attending: Orthopedic Surgery

## 2023-08-02 ENCOUNTER — Encounter: Payer: Self-pay | Admitting: Orthopedic Surgery

## 2023-08-02 ENCOUNTER — Ambulatory Visit: Payer: Medicare HMO | Admitting: Certified Registered"

## 2023-08-02 ENCOUNTER — Observation Stay: Payer: Medicare HMO

## 2023-08-02 ENCOUNTER — Observation Stay
Admission: RE | Admit: 2023-08-02 | Discharge: 2023-08-03 | Disposition: A | Discharge status: Home / Self Care | Payer: Medicare HMO | Attending: Orthopedic Surgery | Admitting: Orthopedic Surgery

## 2023-08-02 DIAGNOSIS — Z7982 Long term (current) use of aspirin: Secondary | ICD-10-CM | POA: Insufficient documentation

## 2023-08-02 DIAGNOSIS — Z96652 Presence of left artificial knee joint: Secondary | ICD-10-CM

## 2023-08-02 DIAGNOSIS — Z96651 Presence of right artificial knee joint: Secondary | ICD-10-CM | POA: Insufficient documentation

## 2023-08-02 DIAGNOSIS — M1712 Unilateral primary osteoarthritis, left knee: Secondary | ICD-10-CM | POA: Diagnosis present

## 2023-08-02 DIAGNOSIS — I10 Essential (primary) hypertension: Secondary | ICD-10-CM | POA: Insufficient documentation

## 2023-08-02 DIAGNOSIS — Q630 Accessory kidney: Secondary | ICD-10-CM

## 2023-08-02 DIAGNOSIS — Z79899 Other long term (current) drug therapy: Secondary | ICD-10-CM | POA: Insufficient documentation

## 2023-08-02 DIAGNOSIS — R3129 Other microscopic hematuria: Secondary | ICD-10-CM

## 2023-08-02 HISTORY — PX: KNEE ARTHROPLASTY: SHX992

## 2023-08-02 SURGERY — ARTHROPLASTY, KNEE, TOTAL, USING IMAGELESS COMPUTER-ASSISTED NAVIGATION
Anesthesia: General | Site: Knee | Laterality: Left

## 2023-08-02 MED ORDER — CEFAZOLIN SODIUM-DEXTROSE 2-4 GM/100ML-% IV SOLN
INTRAVENOUS | Status: AC
  Filled 2023-08-02: qty 100

## 2023-08-02 MED ORDER — HYDROMORPHONE HCL 1 MG/ML IJ SOLN
INTRAMUSCULAR | Status: DC | PRN
  Administered 2023-08-02: .5 mg via INTRAVENOUS

## 2023-08-02 MED ORDER — SENNOSIDES-DOCUSATE SODIUM 8.6-50 MG PO TABS
ORAL_TABLET | ORAL | Status: AC
  Filled 2023-08-02: qty 1

## 2023-08-02 MED ORDER — TRANEXAMIC ACID-NACL 1000-0.7 MG/100ML-% IV SOLN
INTRAVENOUS | Status: AC
  Filled 2023-08-02: qty 100

## 2023-08-02 MED ORDER — MENTHOL 3 MG MT LOZG
1.0000 | LOZENGE | OROMUCOSAL | Status: DC | PRN
  Administered 2023-08-02: 3 mg via ORAL
  Filled 2023-08-02: qty 9

## 2023-08-02 MED ORDER — ASPIRIN 81 MG PO CHEW
81.0000 mg | CHEWABLE_TABLET | Freq: Two times a day (BID) | ORAL | Status: DC
  Administered 2023-08-03: 81 mg via ORAL

## 2023-08-02 MED ORDER — SODIUM CHLORIDE 0.9 % IR SOLN
Status: DC | PRN
  Administered 2023-08-02: 1

## 2023-08-02 MED ORDER — DEXAMETHASONE SODIUM PHOSPHATE 10 MG/ML IJ SOLN
INTRAMUSCULAR | Status: DC | PRN
  Administered 2023-08-02: 5 mg via INTRAVENOUS

## 2023-08-02 MED ORDER — ACETAMINOPHEN 10 MG/ML IV SOLN
INTRAVENOUS | Status: AC
  Filled 2023-08-02: qty 100

## 2023-08-02 MED ORDER — SURGIRINSE WOUND IRRIGATION SYSTEM - OPTIME
TOPICAL | Status: DC | PRN
  Administered 2023-08-02: 450 mL via TOPICAL

## 2023-08-02 MED ORDER — SODIUM CHLORIDE 0.9 % IV SOLN: INTRAVENOUS | Status: DC

## 2023-08-02 MED ORDER — EPHEDRINE 5 MG/ML INJ
INTRAVENOUS | Status: AC
  Filled 2023-08-02: qty 5

## 2023-08-02 MED ORDER — METOCLOPRAMIDE HCL 10 MG PO TABS
ORAL_TABLET | ORAL | Status: AC
  Filled 2023-08-02: qty 1

## 2023-08-02 MED ORDER — PANTOPRAZOLE SODIUM 40 MG PO TBEC
DELAYED_RELEASE_TABLET | ORAL | Status: AC
  Filled 2023-08-02: qty 1

## 2023-08-02 MED ORDER — TRAMADOL HCL 50 MG PO TABS
ORAL_TABLET | ORAL | Status: AC
  Filled 2023-08-02: qty 1

## 2023-08-02 MED ORDER — CELECOXIB 200 MG PO CAPS
ORAL_CAPSULE | ORAL | Status: AC
Start: 2023-08-02 — End: ?
  Filled 2023-08-02: qty 2

## 2023-08-02 MED ORDER — GABAPENTIN 300 MG PO CAPS
300.0000 mg | ORAL_CAPSULE | Freq: Once | ORAL | Status: AC
  Administered 2023-08-02: 300 mg via ORAL

## 2023-08-02 MED ORDER — DEXAMETHASONE SODIUM PHOSPHATE 10 MG/ML IJ SOLN
8.0000 mg | Freq: Once | INTRAMUSCULAR | Status: AC
  Administered 2023-08-02: 8 mg via INTRAVENOUS

## 2023-08-02 MED ORDER — OXYCODONE HCL 5 MG PO TABS: 5.0000 mg | ORAL_TABLET | Freq: Once | ORAL | Status: DC | PRN

## 2023-08-02 MED ORDER — PHENYLEPHRINE HCL-NACL 20-0.9 MG/250ML-% IV SOLN
INTRAVENOUS | Status: DC | PRN
  Administered 2023-08-02: 20 ug/min via INTRAVENOUS

## 2023-08-02 MED ORDER — GABAPENTIN 300 MG PO CAPS
ORAL_CAPSULE | ORAL | Status: AC
  Filled 2023-08-02: qty 1

## 2023-08-02 MED ORDER — ACETAMINOPHEN 325 MG PO TABS: 325.0000 mg | ORAL_TABLET | Freq: Four times a day (QID) | ORAL | Status: DC | PRN

## 2023-08-02 MED ORDER — FERROUS SULFATE 325 (65 FE) MG PO TABS
ORAL_TABLET | ORAL | Status: AC
  Filled 2023-08-02: qty 1

## 2023-08-02 MED ORDER — ONDANSETRON HCL 4 MG PO TABS: 4.0000 mg | ORAL_TABLET | Freq: Four times a day (QID) | ORAL | Status: DC | PRN

## 2023-08-02 MED ORDER — PANTOPRAZOLE SODIUM 40 MG PO TBEC
40.0000 mg | DELAYED_RELEASE_TABLET | Freq: Two times a day (BID) | ORAL | Status: DC
  Administered 2023-08-02 – 2023-08-03 (×3): 40 mg via ORAL

## 2023-08-02 MED ORDER — PHENYLEPHRINE HCL-NACL 20-0.9 MG/250ML-% IV SOLN
INTRAVENOUS | Status: AC
  Filled 2023-08-02: qty 250

## 2023-08-02 MED ORDER — ROCURONIUM BROMIDE 10 MG/ML (PF) SYRINGE
PREFILLED_SYRINGE | INTRAVENOUS | Status: AC
  Filled 2023-08-02: qty 10

## 2023-08-02 MED ORDER — PHENYLEPHRINE 80 MCG/ML (10ML) SYRINGE FOR IV PUSH (FOR BLOOD PRESSURE SUPPORT)
PREFILLED_SYRINGE | INTRAVENOUS | Status: DC | PRN
  Administered 2023-08-02 (×5): 80 ug via INTRAVENOUS

## 2023-08-02 MED ORDER — METOCLOPRAMIDE HCL 10 MG PO TABS
10.0000 mg | ORAL_TABLET | Freq: Three times a day (TID) | ORAL | Status: DC
  Administered 2023-08-02 – 2023-08-03 (×3): 10 mg via ORAL

## 2023-08-02 MED ORDER — MELOXICAM 7.5 MG PO TABS
7.5000 mg | ORAL_TABLET | Freq: Two times a day (BID) | ORAL | Status: DC
  Administered 2023-08-02 – 2023-08-03 (×3): 7.5 mg via ORAL
  Filled 2023-08-02 (×3): qty 1

## 2023-08-02 MED ORDER — CEFAZOLIN SODIUM-DEXTROSE 2-4 GM/100ML-% IV SOLN
INTRAVENOUS | Status: AC
Start: 2023-08-02 — End: ?
  Filled 2023-08-02: qty 100

## 2023-08-02 MED ORDER — LIDOCAINE HCL (PF) 2 % IJ SOLN
INTRAMUSCULAR | Status: AC
  Filled 2023-08-02: qty 5

## 2023-08-02 MED ORDER — CEFAZOLIN SODIUM-DEXTROSE 2-4 GM/100ML-% IV SOLN
2.0000 g | Freq: Four times a day (QID) | INTRAVENOUS | Status: AC
  Administered 2023-08-02 (×2): 2 g via INTRAVENOUS

## 2023-08-02 MED ORDER — PROPOFOL 10 MG/ML IV BOLUS
INTRAVENOUS | Status: AC
  Filled 2023-08-02: qty 20

## 2023-08-02 MED ORDER — PROPOFOL 1000 MG/100ML IV EMUL
INTRAVENOUS | Status: AC
  Filled 2023-08-02: qty 100

## 2023-08-02 MED ORDER — HYDROMORPHONE HCL 1 MG/ML IJ SOLN: 0.5000 mg | INTRAMUSCULAR | Status: DC | PRN

## 2023-08-02 MED ORDER — TRAMADOL HCL 50 MG PO TABS
50.0000 mg | ORAL_TABLET | ORAL | Status: DC | PRN
Start: 2023-08-02 — End: 2023-08-03
  Administered 2023-08-02 (×2): 50 mg via ORAL

## 2023-08-02 MED ORDER — ONDANSETRON HCL 4 MG/2ML IJ SOLN
INTRAMUSCULAR | Status: DC | PRN
  Administered 2023-08-02: 4 mg via INTRAVENOUS

## 2023-08-02 MED ORDER — OXYCODONE HCL 5 MG/5ML PO SOLN: 5.0000 mg | Freq: Once | ORAL | Status: DC | PRN

## 2023-08-02 MED ORDER — ONDANSETRON HCL 4 MG/2ML IJ SOLN
INTRAMUSCULAR | Status: AC
  Filled 2023-08-02: qty 2

## 2023-08-02 MED ORDER — SIMVASTATIN 20 MG PO TABS
20.0000 mg | ORAL_TABLET | Freq: Every evening | ORAL | Status: DC
  Administered 2023-08-02: 20 mg via ORAL
  Filled 2023-08-02: qty 1

## 2023-08-02 MED ORDER — DEXAMETHASONE SODIUM PHOSPHATE 10 MG/ML IJ SOLN
INTRAMUSCULAR | Status: AC
  Filled 2023-08-02: qty 1

## 2023-08-02 MED ORDER — CHLORHEXIDINE GLUCONATE 0.12 % MT SOLN
15.0000 mL | Freq: Once | OROMUCOSAL | Status: AC
  Administered 2023-08-02: 15 mL via OROMUCOSAL

## 2023-08-02 MED ORDER — ACETAMINOPHEN 10 MG/ML IV SOLN: 1000.0000 mg | Freq: Four times a day (QID) | INTRAVENOUS | Status: DC

## 2023-08-02 MED ORDER — DIPHENHYDRAMINE HCL 12.5 MG/5ML PO ELIX: 12.5000 mg | ORAL_SOLUTION | ORAL | Status: DC | PRN

## 2023-08-02 MED ORDER — FENTANYL CITRATE (PF) 100 MCG/2ML IJ SOLN: 25.0000 ug | INTRAMUSCULAR | Status: DC | PRN

## 2023-08-02 MED ORDER — TRANEXAMIC ACID-NACL 1000-0.7 MG/100ML-% IV SOLN
1000.0000 mg | Freq: Once | INTRAVENOUS | Status: AC
  Administered 2023-08-02: 1000 mg via INTRAVENOUS

## 2023-08-02 MED ORDER — OXYCODONE HCL 5 MG PO TABS
5.0000 mg | ORAL_TABLET | ORAL | Status: DC | PRN
  Administered 2023-08-02: 5 mg via ORAL

## 2023-08-02 MED ORDER — AMLODIPINE BESYLATE 10 MG PO TABS
5.0000 mg | ORAL_TABLET | Freq: Every evening | ORAL | Status: DC
  Administered 2023-08-02: 5 mg via ORAL

## 2023-08-02 MED ORDER — MAGNESIUM HYDROXIDE 400 MG/5ML PO SUSP
30.0000 mL | Freq: Every day | ORAL | Status: DC
  Administered 2023-08-02: 30 mL via ORAL

## 2023-08-02 MED ORDER — ACETAMINOPHEN 10 MG/ML IV SOLN
INTRAVENOUS | Status: DC | PRN
  Administered 2023-08-02: 1000 mg via INTRAVENOUS

## 2023-08-02 MED ORDER — OXYCODONE HCL 5 MG PO TABS
ORAL_TABLET | ORAL | Status: AC
  Filled 2023-08-02: qty 1

## 2023-08-02 MED ORDER — FENTANYL CITRATE (PF) 100 MCG/2ML IJ SOLN
INTRAMUSCULAR | Status: AC
  Filled 2023-08-02: qty 2

## 2023-08-02 MED ORDER — PHENYLEPHRINE 80 MCG/ML (10ML) SYRINGE FOR IV PUSH (FOR BLOOD PRESSURE SUPPORT)
PREFILLED_SYRINGE | INTRAVENOUS | Status: AC
  Filled 2023-08-02: qty 10

## 2023-08-02 MED ORDER — LACTATED RINGERS IV SOLN: INTRAVENOUS | Status: DC

## 2023-08-02 MED ORDER — TRANEXAMIC ACID-NACL 1000-0.7 MG/100ML-% IV SOLN
1000.0000 mg | INTRAVENOUS | Status: AC
  Administered 2023-08-02: 1000 mg via INTRAVENOUS

## 2023-08-02 MED ORDER — CELECOXIB 200 MG PO CAPS
400.0000 mg | ORAL_CAPSULE | Freq: Once | ORAL | Status: AC
  Administered 2023-08-02: 400 mg via ORAL

## 2023-08-02 MED ORDER — PROPOFOL 10 MG/ML IV BOLUS
INTRAVENOUS | Status: DC | PRN
  Administered 2023-08-02: 130 mg via INTRAVENOUS

## 2023-08-02 MED ORDER — ORAL CARE MOUTH RINSE: 15.0000 mL | Freq: Once | OROMUCOSAL | Status: AC

## 2023-08-02 MED ORDER — LACTATED RINGERS IR SOLN
Status: DC | PRN
  Administered 2023-08-02: 3000 mL

## 2023-08-02 MED ORDER — FLEET ENEMA RE ENEM: 1.0000 | ENEMA | Freq: Once | RECTAL | Status: DC | PRN

## 2023-08-02 MED ORDER — ONDANSETRON HCL 4 MG/2ML IJ SOLN: 4.0000 mg | Freq: Four times a day (QID) | INTRAMUSCULAR | Status: DC | PRN

## 2023-08-02 MED ORDER — EPHEDRINE SULFATE-NACL 50-0.9 MG/10ML-% IV SOSY
PREFILLED_SYRINGE | INTRAVENOUS | Status: DC | PRN
  Administered 2023-08-02: 5 mg via INTRAVENOUS

## 2023-08-02 MED ORDER — PHENOL 1.4 % MT LIQD: 1.0000 | OROMUCOSAL | Status: DC | PRN

## 2023-08-02 MED ORDER — SENNOSIDES-DOCUSATE SODIUM 8.6-50 MG PO TABS
1.0000 | ORAL_TABLET | Freq: Two times a day (BID) | ORAL | Status: DC
  Administered 2023-08-02 – 2023-08-03 (×3): 1 via ORAL

## 2023-08-02 MED ORDER — FENTANYL CITRATE (PF) 100 MCG/2ML IJ SOLN
INTRAMUSCULAR | Status: DC | PRN
  Administered 2023-08-02 (×4): 50 ug via INTRAVENOUS

## 2023-08-02 MED ORDER — ENSURE PRE-SURGERY PO LIQD
296.0000 mL | Freq: Once | ORAL | Status: DC
  Filled 2023-08-02: qty 296

## 2023-08-02 MED ORDER — ACETAMINOPHEN 10 MG/ML IV SOLN
1000.0000 mg | Freq: Four times a day (QID) | INTRAVENOUS | Status: DC
  Administered 2023-08-02 – 2023-08-03 (×3): 1000 mg via INTRAVENOUS

## 2023-08-02 MED ORDER — FERROUS SULFATE 325 (65 FE) MG PO TABS
325.0000 mg | ORAL_TABLET | Freq: Two times a day (BID) | ORAL | Status: DC
Start: 2023-08-02 — End: 2023-08-03
  Administered 2023-08-02 – 2023-08-03 (×2): 325 mg via ORAL

## 2023-08-02 MED ORDER — SODIUM CHLORIDE (PF) 0.9 % IJ SOLN
INTRAMUSCULAR | Status: DC | PRN
  Administered 2023-08-02: 120 mL

## 2023-08-02 MED ORDER — SUGAMMADEX SODIUM 200 MG/2ML IV SOLN
INTRAVENOUS | Status: DC | PRN
  Administered 2023-08-02: 125 mg via INTRAVENOUS

## 2023-08-02 MED ORDER — MAGNESIUM GLUCONATE 500 MG PO TABS
500.0000 mg | ORAL_TABLET | ORAL | Status: DC
  Administered 2023-08-03: 500 mg via ORAL
  Filled 2023-08-02: qty 1

## 2023-08-02 MED ORDER — MAGNESIUM HYDROXIDE 400 MG/5ML PO SUSP
ORAL | Status: AC
  Filled 2023-08-02: qty 30

## 2023-08-02 MED ORDER — LIDOCAINE HCL (CARDIAC) PF 100 MG/5ML IV SOSY
PREFILLED_SYRINGE | INTRAVENOUS | Status: DC | PRN
  Administered 2023-08-02: 40 mg via INTRAVENOUS

## 2023-08-02 MED ORDER — ROCURONIUM BROMIDE 100 MG/10ML IV SOLN
INTRAVENOUS | Status: DC | PRN
  Administered 2023-08-02: 30 mg via INTRAVENOUS

## 2023-08-02 MED ORDER — CEFAZOLIN SODIUM-DEXTROSE 2-4 GM/100ML-% IV SOLN
2.0000 g | INTRAVENOUS | Status: AC
  Administered 2023-08-02: 2 g via INTRAVENOUS

## 2023-08-02 MED ORDER — BISACODYL 10 MG RE SUPP: 10.0000 mg | Freq: Every day | RECTAL | Status: DC | PRN

## 2023-08-02 MED ORDER — OXYCODONE HCL 5 MG PO TABS: 10.0000 mg | ORAL_TABLET | ORAL | Status: DC | PRN

## 2023-08-02 MED ORDER — AMLODIPINE BESYLATE 5 MG PO TABS
ORAL_TABLET | ORAL | Status: AC
  Filled 2023-08-02: qty 1

## 2023-08-02 MED ORDER — CHLORHEXIDINE GLUCONATE 0.12 % MT SOLN
OROMUCOSAL | Status: AC
  Filled 2023-08-02: qty 15

## 2023-08-02 MED ORDER — HYDROMORPHONE HCL 1 MG/ML IJ SOLN
INTRAMUSCULAR | Status: AC
  Filled 2023-08-02: qty 1

## 2023-08-02 MED ORDER — CHLORHEXIDINE GLUCONATE 4 % EX SOLN: 60.0000 mL | Freq: Once | CUTANEOUS | Status: DC

## 2023-08-02 MED ORDER — ALUM & MAG HYDROXIDE-SIMETH 200-200-20 MG/5ML PO SUSP: 30.0000 mL | ORAL | Status: DC | PRN

## 2023-08-02 MED ORDER — MECLIZINE HCL 25 MG PO TABS
25.0000 mg | ORAL_TABLET | Freq: Three times a day (TID) | ORAL | Status: DC | PRN
  Filled 2023-08-02: qty 1

## 2023-08-02 SURGICAL SUPPLY — 65 items
ATTUNE PS FEM LT SZ 4 CEM KNEE (Femur) IMPLANT
ATTUNE PSRP INSR SZ4 8 KNEE (Insert) IMPLANT
BASEPLATE TIBIAL ROTATING SZ 4 (Knees) IMPLANT
BATTERY INSTRU NAVIGATION (MISCELLANEOUS) ×4 IMPLANT
BIT DRILL QUICK REL 1/8 2PK SL (BIT) ×1 IMPLANT
BLADE CLIPPER SURG (BLADE) IMPLANT
BLADE SAW 70X12.5 (BLADE) ×1 IMPLANT
BLADE SAW 90X13X1.19 OSCILLAT (BLADE) ×1 IMPLANT
BLADE SAW 90X25X1.19 OSCILLAT (BLADE) ×1 IMPLANT
BONE CEMENT GENTAMICIN (Cement) ×2 IMPLANT
BRUSH SCRUB EZ PLAIN DRY (MISCELLANEOUS) ×1 IMPLANT
CEMENT BONE GENTAMICIN 40 (Cement) IMPLANT
COOLER POLAR GLACIER W/PUMP (MISCELLANEOUS) ×1 IMPLANT
CUFF TRNQT CYL 24X4X16.5-23 (TOURNIQUET CUFF) IMPLANT
DRAPE SHEET LG 3/4 BI-LAMINATE (DRAPES) ×1 IMPLANT
DRSG AQUACEL AG ADV 3.5X14 (GAUZE/BANDAGES/DRESSINGS) ×1 IMPLANT
DRSG MEPILEX SACRM 8.7X9.8 (GAUZE/BANDAGES/DRESSINGS) ×1 IMPLANT
DRSG TEGADERM 4X4.75 (GAUZE/BANDAGES/DRESSINGS) ×1 IMPLANT
DURAPREP 26ML APPLICATOR (WOUND CARE) ×2 IMPLANT
ELECT CAUTERY BLADE 6.4 (BLADE) ×1 IMPLANT
ELECT REM PT RETURN 9FT ADLT (ELECTROSURGICAL) ×1
ELECTRODE REM PT RTRN 9FT ADLT (ELECTROSURGICAL) ×1 IMPLANT
EVACUATOR 1/8 PVC DRAIN (DRAIN) ×1 IMPLANT
EX-PIN ORTHOLOCK NAV 4X150 (PIN) ×2 IMPLANT
GAUZE XEROFORM 1X8 LF (GAUZE/BANDAGES/DRESSINGS) ×1 IMPLANT
GLOVE BIOGEL M STRL SZ7.5 (GLOVE) ×6 IMPLANT
GLOVE SURG UNDER POLY LF SZ8 (GLOVE) ×2 IMPLANT
GOWN STRL REUS W/ TWL LRG LVL3 (GOWN DISPOSABLE) ×1 IMPLANT
GOWN STRL REUS W/ TWL XL LVL3 (GOWN DISPOSABLE) ×1 IMPLANT
GOWN TOGA ZIPPER T7+ PEEL AWAY (MISCELLANEOUS) ×1 IMPLANT
HOLDER FOLEY CATH W/STRAP (MISCELLANEOUS) ×1 IMPLANT
HOOD PEEL AWAY T7 (MISCELLANEOUS) ×1 IMPLANT
IV NS IRRIG 3000ML ARTHROMATIC (IV SOLUTION) ×1 IMPLANT
KIT TURNOVER KIT A (KITS) ×1 IMPLANT
KNIFE SCULPS 14X20 (INSTRUMENTS) ×1 IMPLANT
MANIFOLD NEPTUNE II (INSTRUMENTS) ×2 IMPLANT
NDL SPNL 20GX3.5 QUINCKE YW (NEEDLE) ×2 IMPLANT
NEEDLE SPNL 20GX3.5 QUINCKE YW (NEEDLE) ×2
PACK TOTAL KNEE (MISCELLANEOUS) ×1 IMPLANT
PAD ABD DERMACEA PRESS 5X9 (GAUZE/BANDAGES/DRESSINGS) ×2 IMPLANT
PAD ARMBOARD 7.5X6 YLW CONV (MISCELLANEOUS) ×3 IMPLANT
PAD WRAPON POLAR KNEE (MISCELLANEOUS) ×1 IMPLANT
PATELLA MEDIAL ATTUN 35MM KNEE (Knees) IMPLANT
PENCIL SMOKE EVACUATOR COATED (MISCELLANEOUS) ×1 IMPLANT
PIN DRILL FIX HALF THREAD (BIT) ×2 IMPLANT
PIN FIXATION 1/8DIA X 3INL (PIN) ×1 IMPLANT
PULSAVAC PLUS IRRIG FAN TIP (DISPOSABLE) ×1
SOLUTION IRRIG SURGIPHOR (IV SOLUTION) ×1 IMPLANT
SPONGE DRAIN TRACH 4X4 STRL 2S (GAUZE/BANDAGES/DRESSINGS) ×1 IMPLANT
STAPLER SKIN PROX 35W (STAPLE) ×1 IMPLANT
STOCKINETTE BIAS CUT 6 980064 (GAUZE/BANDAGES/DRESSINGS) IMPLANT
STOCKINETTE STRL BIAS CUT 8X4 (MISCELLANEOUS) ×1 IMPLANT
STRAP TIBIA SHORT (MISCELLANEOUS) ×1 IMPLANT
SUCTION TUBE FRAZIER 10FR DISP (SUCTIONS) ×1 IMPLANT
SUT VIC AB 0 CT1 36 (SUTURE) ×1 IMPLANT
SUT VIC AB 1 CT1 36 (SUTURE) ×2 IMPLANT
SUT VIC AB 2-0 CT2 27 (SUTURE) ×1 IMPLANT
SYR 30ML LL (SYRINGE) ×2 IMPLANT
TIP FAN IRRIG PULSAVAC PLUS (DISPOSABLE) ×1 IMPLANT
TOWEL OR 17X26 4PK STRL BLUE (TOWEL DISPOSABLE) ×1 IMPLANT
TOWER CARTRIDGE SMART MIX (DISPOSABLE) ×1 IMPLANT
TRAP FLUID SMOKE EVACUATOR (MISCELLANEOUS) ×1 IMPLANT
TRAY FOLEY MTR SLVR 16FR STAT (SET/KITS/TRAYS/PACK) ×1 IMPLANT
WATER STERILE IRR 1000ML POUR (IV SOLUTION) ×1 IMPLANT
WRAPON POLAR PAD KNEE (MISCELLANEOUS) ×1

## 2023-08-02 NOTE — Anesthesia Procedure Notes (Signed)
 Procedure Name: Intubation Date/Time: 08/02/2023 7:28 AM  Performed by: Lennie Lamarr HERO, CRNAPre-anesthesia Checklist: Patient identified, Emergency Drugs available, Suction available and Patient being monitored Patient Re-evaluated:Patient Re-evaluated prior to induction Oxygen Delivery Method: Circle System Utilized Preoxygenation: Pre-oxygenation with 100% oxygen Induction Type: IV induction Ventilation: Mask ventilation without difficulty Laryngoscope Size: McGrath and 3 Grade View: Grade I Tube type: Oral Tube size: 7.0 mm Number of attempts: 1 Airway Equipment and Method: Stylet and Oral airway Placement Confirmation: ETT inserted through vocal cords under direct vision, positive ETCO2 and breath sounds checked- equal and bilateral Secured at: 20 cm Tube secured with: Tape Dental Injury: Teeth and Oropharynx as per pre-operative assessment

## 2023-08-02 NOTE — Plan of Care (Signed)
 xxx

## 2023-08-02 NOTE — Transfer of Care (Signed)
 Immediate Anesthesia Transfer of Care Note  Patient: Terri [name]  Procedure(s) Performed: COMPUTER ASSISTED TOTAL KNEE ARTHROPLASTY (Left: Knee)  Patient Location: PACU  Anesthesia Type:General  Level of Consciousness: awake, alert , oriented, and patient cooperative  Airway & Oxygen Therapy: Patient Spontanous Breathing  Post-op Assessment: Report given to RN, Post -op Vital signs reviewed and stable, and Patient moving all extremities X 4  Post vital signs: Reviewed and stable  Last Vitals:  Vitals Value Taken Time  BP 130/65 08/02/23 1104  Temp    Pulse 99 08/02/23 1106  Resp 15 08/02/23 1106  SpO2 98 % 08/02/23 1106  Vitals shown include unfiled device data.  Last Pain:  Vitals:   08/02/23 0617  PainSc: 4          Complications: No notable events documented.

## 2023-08-02 NOTE — Anesthesia Preprocedure Evaluation (Addendum)
 Anesthesia Evaluation  Patient identified by MRN, date of birth, ID band Patient awake    Reviewed: Allergy & Precautions, NPO status , Patient's Chart, lab work & pertinent test results  History of Anesthesia Complications (+) POST - OP SPINAL HEADACHE and history of anesthetic complications  Airway Mallampati: I  TM Distance: >3 FB Neck ROM: full    Dental  (+) Partial Upper   Pulmonary neg pulmonary ROS   Pulmonary exam normal        Cardiovascular hypertension, On Medications Normal cardiovascular exam     Neuro/Psych negative neurological ROS  negative psych ROS   GI/Hepatic negative GI ROS, Neg liver ROS,,,  Endo/Other  negative endocrine ROS    Renal/GU      Musculoskeletal  (+) Arthritis ,    Abdominal   Peds  Hematology negative hematology ROS (+)   Anesthesia Other Findings Past Medical History: No date: Actinic keratosis No date: Allergic state No date: Arthritis 07/31/2012: Closed fracture of right proximal humerus No date: Dysrhythmia No date: GERD (gastroesophageal reflux disease) No date: History of shingles No date: Hyperlipidemia No date: Hypertension 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 No date: Seasonal allergies No date: Spinal headache     Comment:  after last knee surgery unable to get blood patch b/c of              heparin No date: Vertigo  Past Surgical History: No date: CATARACT EXTRACTION No date: COLONOSCOPY 09/17/2017: COLONOSCOPY WITH PROPOFOL ; N/A     Comment:  Procedure: COLONOSCOPY WITH PROPOFOL ;  Surgeon:               Gaylyn Gladis PENNER, MD;  Location: [facility];                Service: Endoscopy;  Laterality: N/A; No date: DILATION AND CURETTAGE OF UTERUS 02/07/2021: KNEE ARTHROPLASTY; Right     Comment:  Procedure: COMPUTER ASSISTED TOTAL KNEE ARTHROPLASTY -               RNFA;  Surgeon: Mardee Lynwood SQUIBB, MD;  Location: [facility]  ORS;              Service: Orthopedics;  Laterality: Right; No date: VEIN SURGERY; Bilateral     Comment:  vericose vein  BMI    Body Mass Index: 22.69 kg/m      Reproductive/Obstetrics negative OB ROS                             Anesthesia Physical Anesthesia Plan  ASA: 2  Anesthesia Plan: General ETT   Post-op Pain Management: Celebrex  PO (pre-op)*, Gabapentin  PO (pre-op)*, Ofirmev  IV (intra-op)* and Dilaudid  IV   Induction: Intravenous  PONV Risk Score and Plan: 3 and Ondansetron , Dexamethasone  and Treatment may vary due to age or medical condition  Airway Management Planned: Oral ETT  Additional Equipment:   Intra-op Plan:   Post-operative Plan: Extubation in OR  Informed Consent: I have reviewed the patients History and Physical, chart, labs and discussed the procedure including the risks, benefits and alternatives for the proposed anesthesia with the patient or authorized representative who has indicated his/her understanding and acceptance.     Dental Advisory Given  Plan Discussed with: Anesthesiologist, CRNA and Surgeon  Anesthesia Plan Comments: (Patient consented for risks of anesthesia including but not limited to:  - adverse reactions to medications - damage to eyes, teeth, lips or  other oral mucosa - nerve damage due to positioning  - sore throat or hoarseness - Damage to heart, brain, nerves, lungs, other parts of body or loss of life  Patient voiced understanding and assent.)       Anesthesia Quick Evaluation

## 2023-08-02 NOTE — Progress Notes (Signed)
 Patient is not able to walk the distance required to go the bathroom, or he/she is unable to safely negotiate stairs required to access the bathroom.  A 3in1 BSC will alleviate this problem   Amenda Duclos P. Angie Fava M.D.

## 2023-08-02 NOTE — Op Note (Signed)
 OPERATIVE NOTE  DATE OF SURGERY:  08/02/2023  PATIENT NAME:  Terri [name]   DOB: 1940-10-17  MRN: [id number]  PRE-OPERATIVE DIAGNOSIS: Degenerative arthrosis of the left knee, primary  POST-OPERATIVE DIAGNOSIS:  Same  PROCEDURE:  Left total knee arthroplasty using computer-assisted navigation  SURGEON:  Lynwood SHAUNNA Mardee Mickey. M.D.  ASSISTANT:  Sidra Koyanagi, PA-C (present and scrubbed throughout the case, critical for assistance with exposure, retraction, instrumentation, and closure)  ANESTHESIA: general  ESTIMATED BLOOD LOSS: 50 mL  FLUIDS REPLACED: 700 mL of crystalloid  TOURNIQUET TIME: 85 minutes  DRAINS: 2 medium Hemovac drains  SOFT TISSUE RELEASES: Anterior cruciate ligament, posterior cruciate ligament, deep medial collateral ligament, patellofemoral ligament  IMPLANTS UTILIZED: DePuy Attune size 4 posterior stabilized femoral component (cemented), size 4 rotating platform tibial component (cemented), 35 mm medialized dome patella (cemented), and an 8 mm stabilized rotating platform polyethylene insert.  INDICATIONS FOR SURGERY: Terri [name] is a 83 y.o. year old female with a long history of progressive knee pain. X-rays demonstrated severe degenerative changes in tricompartmental fashion. The patient had not seen any significant improvement despite conservative nonsurgical intervention. After discussion of the risks and benefits of surgical intervention, the patient expressed understanding of the risks benefits and agree with plans for total knee arthroplasty.   The risks, benefits, and alternatives were discussed at length including but not limited to the risks of infection, bleeding, nerve injury, stiffness, blood clots, the need for revision surgery, cardiopulmonary complications, among others, and they were willing to proceed.  PROCEDURE IN DETAIL: The patient was brought into the operating room and, after adequate general anesthesia was achieved, a tourniquet was  placed on the patient's upper thigh. The patient's knee and leg were cleaned and prepped with alcohol and DuraPrep and draped in the usual sterile fashion. A timeout was performed as per usual protocol. The lower extremity was exsanguinated using an Esmarch, and the tourniquet was inflated to 300 mmHg. An anterior longitudinal incision was made followed by a standard mid vastus approach. The deep fibers of the medial collateral ligament were elevated in a subperiosteal fashion off of the medial flare of the tibia so as to maintain a continuous soft tissue sleeve. The patella was subluxed laterally and the patellofemoral ligament was incised. Inspection of the knee demonstrated severe degenerative changes with full-thickness loss of articular cartilage. Osteophytes were debrided using a rongeur. Anterior and posterior cruciate ligaments were excised. Two 4.0 mm Schanz pins were inserted in the femur and into the tibia for attachment of the array of trackers used for computer-assisted navigation. Hip center was identified using a circumduction technique. Distal landmarks were mapped using the computer. The distal femur and proximal tibia were mapped using the computer. The distal femoral cutting guide was positioned using computer-assisted navigation so as to achieve a 5 distal valgus cut. The femur was sized and it was felt that a size 4 femoral component was appropriate. A size 4 femoral cutting guide was positioned and the anterior cut was performed and verified using the computer. This was followed by completion of the posterior and chamfer cuts. Femoral cutting guide for the central box was then positioned in the center box cut was performed.  Attention was then directed to the proximal tibia. Medial and lateral menisci were excised. The extramedullary tibial cutting guide was positioned using computer-assisted navigation so as to achieve a 0 varus-valgus alignment and 3 posterior slope. The cut was  performed and verified using the computer. The proximal tibia was sized  and it was felt that a size 4 tibial tray was appropriate. Tibial and femoral trials were inserted followed by insertion of an 8 mm polyethylene insert. This allowed for excellent mediolateral soft tissue balancing both in flexion and in full extension. Finally, the patella was cut and prepared so as to accommodate a 35 mm medialized dome patella. A patella trial was placed and the knee was placed through a range of motion with excellent patellar tracking appreciated. The femoral trial was removed after debridement of posterior osteophytes. The central post-hole for the tibial component was reamed followed by insertion of a keel punch. Tibial trials were then removed. Cut surfaces of bone were irrigated with copious amounts of normal saline using pulsatile lavage and then suctioned dry. Polymethylmethacrylate cement with gentamicin was prepared in the usual fashion using a vacuum mixer. Cement was applied to the cut surface of the proximal tibia as well as along the undersurface of a size 4 rotating platform tibial component. Tibial component was positioned and impacted into place. Excess cement was removed using Personal assistant. Cement was then applied to the cut surfaces of the femur as well as along the posterior flanges of the size 4 femoral component. The femoral component was positioned and impacted into place. Excess cement was removed using Personal assistant. An 8 mm polyethylene trial was inserted and the knee was brought into full extension with steady axial compression applied. Finally, cement was applied to the backside of a 35 mm medialized dome patella and the patellar component was positioned and patellar clamp applied. Excess cement was removed using Personal assistant. After adequate curing of the cement, the tourniquet was deflated after a total tourniquet time of 85 minutes. Hemostasis was achieved using electrocautery. The knee  was irrigated with copious amounts of normal saline using pulsatile lavage followed by 450 ml of Surgiphor and then suctioned dry. 20 mL of 1.3% Exparel  and 60 mL of 0.25% Marcaine  in 40 mL of normal saline was injected along the posterior capsule, medial and lateral gutters, and along the arthrotomy site. An 8 mm stabilized rotating platform polyethylene insert was inserted and the knee was placed through a range of motion with excellent mediolateral soft tissue balancing appreciated and excellent patellar tracking noted. 2 medium drains were placed in the wound bed and brought out through separate stab incisions. The medial parapatellar portion of the incision was reapproximated using interrupted sutures of #1 Vicryl. Subcutaneous tissue was approximated in layers using first #0 Vicryl followed #2-0 Vicryl. The skin was approximated with skin staples. A sterile dressing was applied.  The patient tolerated the procedure well and was transported to the recovery room in stable condition.    Terri Merkey P. Messiah Rovira, Jr., M.D.

## 2023-08-02 NOTE — Evaluation (Signed)
 Physical Therapy Evaluation Patient Details Name: Terri [name] MRN: [id number] DOB: 1941/01/10 Today's Date: 08/02/2023  History of Present Illness  Terri [name] is an 82yoF who [facility] on 08[facility]02[facility]23 for left TKA. PT underwent Rt TKA in July 2022.  Clinical Impression  Pt in BR with NSG on arrival, family at bedside. Pt is pain controlled, tolerating upright activity without PONV. Pt alert, energized, and looking forward to PT evaluation. No extensive cues needed for AMB or transfers. Pt AMB overground 121ft easily, no cues needed to achieve good symmetry of gait in step length and cadence. Pt up in chair at end of session, education on knee precautions, polar care, activity modification. All needs met.       If plan is discharge home, recommend the following: Assist for transportation;Assistance with cooking/housework;Help with stairs or ramp for entrance   Can travel by private vehicle        Equipment Recommendations None recommended by PT  Recommendations for Other Services       Functional Status Assessment Patient has had a recent decline in their functional status and demonstrates the ability to make significant improvements in function in a reasonable and predictable amount of time.     Precautions / Restrictions Precautions Precautions: Knee Precaution Booklet Issued: No Restrictions Weight Bearing Restrictions Per Provider Order: Yes LLE Weight Bearing Per Provider Order: Weight bearing as tolerated      Mobility  Bed Mobility                    Transfers Overall transfer level: Needs assistance Equipment used: Rolling walker (2 wheels) Transfers: Sit to/from Stand Sit to Stand: Supervision           General transfer comment: mod effort, no LOB, pain controlled    Ambulation/Gait Ambulation/Gait assistance: Contact guard assist, Supervision Gait Distance (Feet): 150 Feet Assistive device: Rolling walker (2 wheels) Gait  Pattern/deviations: Step-through pattern Gait velocity: 0.75m/s     General Gait Details: minimal antalgia  Stairs            Wheelchair Mobility     Tilt Bed    Modified Rankin (Stroke Patients Only)       Balance Overall balance assessment: Modified Independent, History of Falls                                           Pertinent Vitals/Pain Pain Assessment Pain Assessment: 0-10 Pain Score: 5  Pain Location: operative knee Pain Intervention(s): Limited activity within patient's tolerance, Monitored during session, Premedicated before session    Home Living Family/patient expects to be discharged to:: Private residence Living Arrangements: Spouse/significant other Available Help at Discharge: Family (DTR Damien will be staying with her, then DTR Sharyne will visit from [place]  over weekend.) Type of Home: House Home Access: Stairs to enter Entrance Stairs-Rails: Right Entrance Stairs-Number of Steps: 3   Home Layout: One level;Full bath on main level Home Equipment: Rolling Walker (2 wheels);BSC/3in1;Grab bars - tub/shower;Shower seat - built in      Prior Function Prior Level of Function : Independent/Modified Independent;Driving             Mobility Comments: modI, no device, balance issues at baseline, ADLs Comments: independent     Extremity/Trunk Assessment                Communication  Cognition                                                General Comments      Exercises Total Joint Exercises Goniometric ROM: Left knee ROM: 18-70 degrees   Assessment/Plan    PT Assessment Patient needs continued PT services  PT Problem List Decreased strength;Decreased range of motion;Decreased activity tolerance;Decreased balance;Decreased mobility;Decreased knowledge of use of DME;Decreased knowledge of precautions;Decreased safety awareness       PT Treatment Interventions DME instruction;Gait  training;Patient/family education;Functional mobility training;Stair training;Therapeutic activities;Therapeutic exercise;Balance training    PT Goals (Current goals can be found in the Care Plan section)  Acute Rehab PT Goals Patient Stated Goal: DC to home and avoid falling PT Goal Formulation: With patient Time For Goal Achievement: 08/16/23 Potential to Achieve Goals: Good    Frequency BID     Co-evaluation               AM-PAC PT 6 Clicks Mobility  Outcome Measure Help needed turning from your back to your side while in a flat bed without using bedrails?: A Lot Help needed moving from lying on your back to sitting on the side of a flat bed without using bedrails?: A Lot Help needed moving to and from a bed to a chair (including a wheelchair)?: A Little Help needed standing up from a chair using your arms (e.g., wheelchair or bedside chair)?: A Little Help needed to walk in hospital room?: A Little Help needed climbing 3-5 steps with a railing? : A Little 6 Click Score: 16    End of Session Equipment Utilized During Treatment: Gait belt Activity Tolerance: Patient tolerated treatment well;No increased pain Patient left: in chair;with call bell/phone within reach;with nursing/sitter in room;with family/visitor present Nurse Communication: Mobility status PT Visit Diagnosis: Difficulty in walking, not elsewhere classified (R26.2);Other abnormalities of gait and mobility (R26.89)    Time: 8371-8340 PT Time Calculation (min) (ACUTE ONLY): 31 min   Charges:   PT Evaluation $PT Eval Moderate Complexity: 1 Mod PT Treatments $Gait Training: 8-22 mins PT General Charges $$ ACUTE PT VISIT: 1 Visit        5:20 PM, 08/02/23 Peggye JAYSON Linear, PT, DPT Physical Therapist - [facility]  [phone] (ASCOM)    Terri [name] 08/02/2023, 5:18 PM

## 2023-08-02 NOTE — Interval H&P Note (Signed)
 History and Physical Interval Note:  08/02/2023 6:22 AM  Terri [name]  has presented today for surgery, with the diagnosis of PRIMARY OSTEOARTHRITIS OF LEFT KNEE..  The various methods of treatment have been discussed with the patient and family. After consideration of risks, benefits and other options for treatment, the patient has consented to  Procedure(s): COMPUTER ASSISTED TOTAL KNEE ARTHROPLASTY (Left) as a surgical intervention.  The patient's history has been reviewed, patient examined, no change in status, stable for surgery.  I have reviewed the patient's chart and labs.  Questions were answered to the patient's satisfaction.     [name] P Purity Irmen

## 2023-08-03 ENCOUNTER — Encounter: Payer: Self-pay | Admitting: Orthopedic Surgery

## 2023-08-03 DIAGNOSIS — M1712 Unilateral primary osteoarthritis, left knee: Secondary | ICD-10-CM | POA: Diagnosis not present

## 2023-08-03 MED ORDER — SENNOSIDES-DOCUSATE SODIUM 8.6-50 MG PO TABS
ORAL_TABLET | ORAL | Status: AC
  Filled 2023-08-03: qty 1

## 2023-08-03 MED ORDER — PANTOPRAZOLE SODIUM 40 MG PO TBEC
DELAYED_RELEASE_TABLET | ORAL | Status: AC
  Filled 2023-08-03: qty 1

## 2023-08-03 MED ORDER — METOCLOPRAMIDE HCL 10 MG PO TABS
ORAL_TABLET | ORAL | Status: AC
  Filled 2023-08-03: qty 1

## 2023-08-03 MED ORDER — ASPIRIN 81 MG PO TBEC: 81.0000 mg | DELAYED_RELEASE_TABLET | Freq: Two times a day (BID) | ORAL | Status: AC

## 2023-08-03 MED ORDER — TRAMADOL HCL 50 MG PO TABS: 50.0000 mg | ORAL_TABLET | Freq: Four times a day (QID) | ORAL | 0 refills | Status: AC | PRN

## 2023-08-03 MED ORDER — ACETAMINOPHEN 10 MG/ML IV SOLN
INTRAVENOUS | Status: AC
Start: 2023-08-03 — End: ?
  Filled 2023-08-03: qty 100

## 2023-08-03 MED ORDER — MAGNESIUM HYDROXIDE 400 MG/5ML PO SUSP
ORAL | Status: AC
  Filled 2023-08-03: qty 30

## 2023-08-03 MED ORDER — MELOXICAM 7.5 MG PO TABS: 7.5000 mg | ORAL_TABLET | Freq: Every day | ORAL | 1 refills | Status: AC | PRN

## 2023-08-03 MED ORDER — OXYCODONE HCL 5 MG PO TABS
2.5000 mg | ORAL_TABLET | ORAL | Status: DC | PRN
Start: 2023-08-03 — End: 2023-08-03

## 2023-08-03 MED ORDER — OXYCODONE HCL 5 MG PO TABS: 2.5000 mg | ORAL_TABLET | ORAL | 0 refills | Status: AC | PRN

## 2023-08-03 MED ORDER — FERROUS SULFATE 325 (65 FE) MG PO TABS
ORAL_TABLET | ORAL | Status: AC
  Filled 2023-08-03: qty 1

## 2023-08-03 MED ORDER — ASPIRIN 81 MG PO CHEW
CHEWABLE_TABLET | ORAL | Status: AC
  Filled 2023-08-03: qty 1

## 2023-08-03 NOTE — Plan of Care (Signed)
   Problem: Activity: Goal: Ability to avoid complications of mobility impairment will improve Outcome: Progressing   Problem: Clinical Measurements: Goal: Postoperative complications will be avoided or minimized Outcome: Progressing   Problem: Pain Management: Goal: Pain level will decrease with appropriate interventions Outcome: Progressing

## 2023-08-03 NOTE — TOC Progression Note (Signed)
 Transition of Care [facility]) - Progression Note    Patient Details  Name: Terri [name] MRN: [id number] Date of Birth: [birth year]  [facility] CM/SW Contact  Royanne JINNY Bernheim, RN Phone Number: 08/03/2023, 8:56 AM  Clinical Narrative:     [facility] is set up for Home health prior to surgery by the surgeons office, she has DME at home including Rolling Walker (2 wheels);BSC/3in1;Grab bars - tub/shower;Shower seat - built in        Expected Discharge Plan and Services         Expected Discharge Date: 08/03/23                                     Social Determinants of Health (SDOH) Interventions SDOH Screenings   Food Insecurity: No Food Insecurity (08/02/2023)  Housing: Low Risk  (08/02/2023)  Transportation Needs: No Transportation Needs (08/02/2023)  Utilities: Not At Risk (08/02/2023)  Financial Resource Strain: Patient Declined (08/25/2022)   Received from [facility], [facility]  Social Connections: Socially Integrated (08/02/2023)  Tobacco Use: Low Risk  (08/02/2023)    Readmission Risk Interventions     No data to display

## 2023-08-03 NOTE — Progress Notes (Signed)
 Physical Therapy Treatment Patient Details Name: Terri [name] MRN: [id number] DOB: 1940/08/21 Today's Date: 08/03/2023   History of Present Illness Terri [name] is an 82yoF who comes to [facility] on 08/02/23 for left TKA. PT underwent Rt TKA in July 2022.    PT Comments  Pt seen after meds/breakfast, denies any pain this morning. No assist needed, no safety concerns, and moderate effort or less to perform bed mobility, transfers, and AMB. Pt given cues for sequencing for stairs performance. Gait speed and ROM are both remarkably improved since POD0 and both are far better than what is typical with this surgical timeline. Pt advances AMB to >266ft without signs of antalgic decomposition. Handout provided with HEP education. All pt benchmarks achieved, all education completed, pt reports confidence in safe DC to home today, denies any further PT needs.    If plan is discharge home, recommend the following: Assist for transportation;Assistance with cooking/housework;Help with stairs or ramp for entrance   Can travel by private vehicle        Equipment Recommendations  None recommended by PT    Recommendations for Other Services       Precautions / Restrictions Precautions Precautions: Knee Precaution Booklet Issued: Yes (comment) Restrictions LLE Weight Bearing Per Provider Order: Weight bearing as tolerated     Mobility  Bed Mobility Overal bed mobility: Modified Independent                  Transfers Overall transfer level: Needs assistance Equipment used: Rolling walker (2 wheels) Transfers: Sit to/from Stand Sit to Stand: Supervision           General transfer comment: cue to normalize movements, pt fixated on correct performance of movement which results in abnormal, unnatural posturing of surgical leg.    Ambulation/Gait   Gait Distance (Feet): 270 Feet Assistive device: Rolling walker (2 wheels) Gait Pattern/deviations: Step-through pattern, WFL(Within  Functional Limits) Gait velocity: 0.43m/s (0.87m/s POD0)     General Gait Details: minimal antalgia   Stairs Stairs: Yes   Stair Management: One rail Right, Step to pattern, Forwards Number of Stairs: 8 General stair comments: cues for correct sequencing, same pattern as PTA   Wheelchair Mobility     Tilt Bed    Modified Rankin (Stroke Patients Only)       Balance                                            Cognition Arousal: Alert Behavior During Therapy: WFL for tasks assessed/performed Overall Cognitive Status: Within Functional Limits for tasks assessed                                          Exercises Total Joint Exercises Ankle Circles/Pumps: AROM, Both, 10 reps, Supine Quad Sets: AROM, Both, 5 reps, Supine Heel Slides: AAROM, AROM, Left, 10 reps, Supine Hip ABduction/ADduction: AROM, Left, 10 reps, Supine Straight Leg Raises: AROM, Left, 5 reps, Supine Goniometric ROM: Left knee ROM: 10-85 degrees    General Comments General comments (skin integrity, edema, etc.): polar care intact pre/post session      Pertinent Vitals/Pain Pain Assessment Pain Assessment: No/denies pain    Home Living Family/patient expects to be discharged to:: Private residence Living Arrangements: Spouse/significant other Available Help at Discharge: Family (  DTR Damien will be staying with her, then DTR Sharyne will visit from [place]  over weekend.) Type of Home: House Home Access: Stairs to enter Entrance Stairs-Rails: Right Entrance Stairs-Number of Steps: 3   Home Layout: One level;Full bath on main level Home Equipment: Rolling Walker (2 wheels);BSC/3in1;Grab bars - tub/shower;Shower seat - built in      Prior Function            PT Goals (current goals can now be found in the care plan section) Acute Rehab PT Goals Patient Stated Goal: DC to home and avoid falling PT Goal Formulation: With patient Potential to Achieve Goals:  Good Progress towards PT goals: Goals met/education completed, patient discharged from PT    Frequency    BID      PT Plan      Co-evaluation              AM-PAC PT 6 Clicks Mobility   Outcome Measure  Help needed turning from your back to your side while in a flat bed without using bedrails?: A Lot Help needed moving from lying on your back to sitting on the side of a flat bed without using bedrails?: A Lot Help needed moving to and from a bed to a chair (including a wheelchair)?: A Little Help needed standing up from a chair using your arms (e.g., wheelchair or bedside chair)?: A Little Help needed to walk in hospital room?: A Little Help needed climbing 3-5 steps with a railing? : A Little 6 Click Score: 16    End of Session Equipment Utilized During Treatment: Gait belt Activity Tolerance: Patient tolerated treatment well;No increased pain Patient left: in chair;with call bell/phone within reach;with nursing/sitter in room;with family/visitor present Nurse Communication: Mobility status PT Visit Diagnosis: Difficulty in walking, not elsewhere classified (R26.2);Other abnormalities of gait and mobility (R26.89)     Time: 9154-9093 PT Time Calculation (min) (ACUTE ONLY): 21 min  Charges:    $Therapeutic Activity: 8-22 mins PT General Charges $$ ACUTE PT VISIT: 1 Visit                    10:24 AM, 08/03/23 Peggye JAYSON Linear, PT, DPT Physical Therapist - [facility]  [phone] (ASCOM)     Ketura Sirek C 08/03/2023, 10:21 AM

## 2023-08-03 NOTE — Evaluation (Addendum)
 Occupational Therapy Evaluation Patient Details Name: Terri [name] MRN: [id number] DOB: 05-02-1941 Today's Date: 08/03/2023   History of Present Illness Terri [name] is an 82yoF who comes to [facility] on 08/02/23 for left TKA. PT underwent Rt TKA in July 2022.   Clinical Impression   Pt was seen for OT evaluation this date. Prior to hospital admission, pt was living at home with her husband. Reports IND/MOD I with ADLs and mobility. Daughters will be staying with her while she recovers.  Pt presents to acute OT demonstrating little to no functional decline or impairment in ADL performance and functional transfers. Edu on role of OT in acute setting and use of AE/AD, including a reacher, long handled sponge, sock aide and shoe horn for ADL performance. Edu on use of polar care system and ease with donning compression socks. Pt demo STS from recliner to RW with SUP and mobility in the room with close SUP d/t baseline balance deficits. She stood at sink to wash face and perform oral care with SUP. Pt then edu to sit on toilet to reduce fall risk during dressing tasks. UB ADLs performed with set up assist and LB ADLs with SBA/close SUP. Family arrived at end of session and briefly educated them on above and written handout provided to maximize carryover. Pt with pain at 3/10 at end of session. Do not anticipate the need for follow up OT services upon acute hospital DC. OT to DC in house.        If plan is discharge home, recommend the following: A little help with walking and/or transfers;Direct supervision/assist for financial management;Supervision due to cognitive status;Assist for transportation;Help with stairs or ramp for entrance    Functional Status Assessment  Patient has not had a recent decline in their functional status  Equipment Recommendations  None recommended by OT    Recommendations for Other Services       Precautions / Restrictions Precautions Precautions: Knee Precaution  Booklet Issued: Yes (comment) Restrictions LLE Weight Bearing Per Provider Order: Weight bearing as tolerated      Mobility Bed Mobility               General bed mobility comments: NT in recliner pre/post session    Transfers Overall transfer level: Needs assistance Equipment used: Rolling walker (2 wheels) Transfers: Sit to/from Stand Sit to Stand: Supervision           General transfer comment: SUP for STS from recliner and SUP for mobility in the room and proper use of RW during mobility      Balance Overall balance assessment: Modified Independent, History of Falls                                         ADL either performed or assessed with clinical judgement   ADL Overall ADL's : Needs assistance/impaired     Grooming: Wash/dry face;Oral care;Standing;Supervision/safety           Upper Body Dressing : Set up;Sitting   Lower Body Dressing: Supervision/safety;Sit to/from stand;Sitting/lateral leans   Toilet Transfer: Supervision/safety;Rolling walker (2 wheels)                   Vision         Perception         Praxis         Pertinent Vitals/Pain Pain Assessment Pain Assessment: 0-10  Pain Score: 3  Pain Location: operative knee Pain Descriptors / Indicators: Sore Pain Intervention(s): Monitored during session, Repositioned     Extremity/Trunk Assessment Upper Extremity Assessment Upper Extremity Assessment: Overall WFL for tasks assessed   Lower Extremity Assessment Lower Extremity Assessment: LLE deficits/detail LLE Deficits / Details: L TKA       Communication Communication Communication: No apparent difficulties   Cognition Arousal: Alert Behavior During Therapy: WFL for tasks assessed/performed Overall Cognitive Status: Within Functional Limits for tasks assessed                                       General Comments  polar care intact pre/post session    Exercises Other  Exercises Other Exercises: Edu on role of OT in acute setting and use of AE/AD, use of polar care system, and ease with donning compression socks.   Shoulder Instructions      Home Living Family/patient expects to be discharged to:: Private residence Living Arrangements: Spouse/significant other Available Help at Discharge: Family (DTR Damien will be staying with her, then DTR Sharyne will visit from [place]  over weekend.) Type of Home: House Home Access: Stairs to enter [employer] of Steps: 3 Entrance Stairs-Rails: Right Home Layout: One level;Full bath on main level         Bathroom Toilet: Handicapped height     Home Equipment: [employer] (2 wheels);BSC/3in1;Grab bars - tub/shower;Shower seat - built in          Prior Functioning/Environment Prior Level of Function : Independent/Modified Independent;Driving             Mobility Comments: modI, no device, balance issues at baseline, ADLs Comments: independent        OT Problem List: Decreased range of motion;Impaired balance (sitting and/or standing)      OT Treatment/Interventions:      OT Goals(Current goals can be found in the care plan section)    OT Frequency:      Co-evaluation              AM-PAC OT 6 Clicks Daily Activity     Outcome Measure Help from another person eating meals?: None Help from another person taking care of personal grooming?: None Help from another person toileting, which includes using toliet, bedpan, or urinal?: A Little Help from another person bathing (including washing, rinsing, drying)?: A Little Help from another person to put on and taking off regular upper body clothing?: None Help from another person to put on and taking off regular lower body clothing?: A Little 6 Click Score: 21   End of Session Equipment Utilized During Treatment: Rolling walker (2 wheels) Nurse Communication: Mobility status  Activity Tolerance: Patient tolerated treatment  well Patient left: in chair;with call bell/phone within reach;with chair alarm set;with family/visitor present  OT Visit Diagnosis: Other abnormalities of gait and mobility (R26.89);Unsteadiness on feet (R26.81);History of falling (Z91.81)                Time: 9090-9055 OT Time Calculation (min): 35 min Charges:  OT General Charges $OT Visit: 1 Visit OT Evaluation $OT Eval Low Complexity: 1 Low OT Treatments $Self Care/Home Management : 8-22 mins  Serenity Fortner, OTR/L 08/03/23, 10:26 AM  Duwaine FORBES Saupe 08/03/2023, 10:23 AM

## 2023-08-03 NOTE — Progress Notes (Signed)
 DISCHARGE NOTE:  Pt and family given discharge instructions, scripts and 2 honeycomb dressings. TED hose on both legs, Pt wheeled to car by staff. Husband providing transportation.

## 2023-08-03 NOTE — Progress Notes (Addendum)
 Subjective: 1 Day Post-Op Procedure(s) (LRB): COMPUTER ASSISTED TOTAL KNEE ARTHROPLASTY (Left) Patient reports pain as mild.   Patient seen in rounds with Dr. Mardee. Patient is well, and has had no acute complaints or problems. Denies any CP, SOB, N/V, fevers or chills. We will start therapy today.  Plan is to go Home after hospital stay.  Objective: Vital signs in last 24 hours: Temp:  [97.8 F (36.6 C)-98.3 F (36.8 C)] 98.3 F (36.8 C) (01/14 0541) Pulse Rate:  [68-99] 71 (01/14 0541) Resp:  [10-19] 16 (01/14 0541) BP: (104-130)/(47-65) 104/47 (01/14 0541) SpO2:  [89 %-100 %] 99 % (01/14 0541) Weight:  [58.1 kg] 58.1 kg (01/13 1139)  Intake/Output from previous day:  Intake/Output Summary (Last 24 hours) at 08/03/2023 0737 Last data filed at 08/03/2023 0300 Gross per 24 hour  Intake 2286.67 ml  Output 480 ml  Net 1806.67 ml    Intake/Output this shift: No intake/output data recorded.  Labs: No results for input(s): HGB in the last 72 hours. No results for input(s): WBC, RBC, HCT, PLT in the last 72 hours. No results for input(s): NA, K, CL, CO2, BUN, CREATININE, GLUCOSE, CALCIUM  in the last 72 hours. No results for input(s): LABPT, INR in the last 72 hours.  EXAM General - Patient is Alert, Appropriate, and Oriented Extremity - Neurologically intact ABD soft Neurovascular intact Sensation intact distally Intact pulses distally Dorsiflexion/Plantar flexion intact No cellulitis present Compartment soft Dressing - dressing C/D/I and no drainage Motor Function - intact, moving foot and toes well on exam.  Patient is neurovascular intact all dermatomes down her left lower extremity.  Posterior tibial pulses appreciated. JP Drain pulled without difficulty. Intact  Past Medical History:  Diagnosis Date   Actinic keratosis    Allergic state    Arthritis    Closed fracture of right proximal humerus 07/31/2012   Dysrhythmia    GERD  (gastroesophageal reflux disease)    History of shingles    Hyperlipidemia    Hypertension    Prolapse of female pelvic organs    Renal duplication    Seasonal allergies    Spinal headache    after last knee surgery unable to get blood patch b/c of heparin   Vertigo     Assessment/Plan: 1 Day Post-Op Procedure(s) (LRB): COMPUTER ASSISTED TOTAL KNEE ARTHROPLASTY (Left) Principal Problem:   History of total knee arthroplasty, left  Estimated body mass index is 22.69 kg/m as calculated from the following:   Height as of this encounter: 5' 3 (1.6 m).   Weight as of this encounter: 58.1 kg. Advance diet Up with therapy  Patient will continue to work with physical therapy to pass postoperative PT protocols, ROM and strengthening  Discussed with the patient continuing to utilize Polar Care  Patient will use bone foam in 20-30 minute intervals  Patient will wear TED hose bilaterally to help prevent DVT and clot formation  Discussed the Aquacel bandage.  This bandage will stay in place 7 days postoperatively.  Can be replaced with honeycomb bandages that will be sent home with the patient  Discussed sending the patient home with tramadol  and oxycodone  for as needed pain management.  Patient will continue on her at home meloxicam  to help with swelling and inflammation.  Patient will take an 81 mg aspirin  twice daily for DVT prophylaxis  JP drain removed without difficulty, intact  Weight-Bearing as tolerated to left leg  Patient will follow-up with [facility] orthopedics in 2 weeks for staple  removal and reevaluation  Fonda Koyanagi, PA-C [facility] 08/03/2023, 7:37 AM

## 2023-08-03 NOTE — Plan of Care (Signed)
   Problem: Activity: Goal: Ability to avoid complications of mobility impairment will improve Outcome: Progressing   Problem: Pain Management: Goal: Pain level will decrease with appropriate interventions Outcome: Progressing

## 2023-08-03 NOTE — Anesthesia Postprocedure Evaluation (Signed)
 Anesthesia Post Note  Patient: Terri [name]  Procedure(s) Performed: COMPUTER ASSISTED TOTAL KNEE ARTHROPLASTY (Left: Knee)  Patient location during evaluation: PACU Anesthesia Type: General Level of consciousness: awake and alert Pain management: pain level controlled Vital Signs Assessment: post-procedure vital signs reviewed and stable Respiratory status: spontaneous breathing, nonlabored ventilation, respiratory function stable and patient connected to nasal cannula oxygen Cardiovascular status: blood pressure returned to baseline and stable Postop Assessment: no apparent nausea or vomiting Anesthetic complications: no   No notable events documented.   Last Vitals:  Vitals:   08/03/23 0200 08/03/23 0541  BP: (!) 104/48 (!) 104/47  Pulse: 68 71  Resp: 16 16  Temp: 36.7 C 36.8 C  SpO2: 98% 99%    Last Pain:  Vitals:   08/03/23 0541  TempSrc: Oral  PainSc: 0-No pain                 [name]

## 2023-08-03 NOTE — Discharge Summary (Addendum)
 Physician Discharge Summary  Subjective: 1 Day Post-Op Procedure(s) (LRB): COMPUTER ASSISTED TOTAL KNEE ARTHROPLASTY (Left) Patient reports pain as mild.   Patient seen in rounds with Dr. Mardee. Patient is well, and has had no acute complaints or problems. Denies any CP, SOB, N/V, fevers or chills. We will continue with therapy today.  Patient is ready to go home  Physician Discharge Summary  Patient ID: Terri [name] MRN: [id number] DOB/AGE: [birth year] 83 y.o.  Admit date: 08/02/2023 Discharge date: 08/03/2023  Admission Diagnoses:  Discharge Diagnoses:  Principal Problem:   History of total knee arthroplasty, left   Discharged Condition: good  Hospital Course: Patient presented to the hospital on 08/02/2023 for an elective left total knee arthroplasty performed by Dr. Mardee. Patient was given 1g of TXA and 2g of Ancef  prior to the procedure. she tolerated the procedure well without any complications. See procedural note below for details. Postoperatively, the patient did very well. she was able to pass PT protocols on post-op day one without any issues. JP drain was removed without any difficulty and was intact. she was able to void her bladder without any difficulty. Physical exam was unremarkable. she denies any SOB, CP, N/V, fevers or chills. Vital signs are stable. Patient is stable to discharge home.\  PROCEDURE:  Left total knee arthroplasty using computer-assisted navigation   SURGEON:  Lynwood SHAUNNA Mardee Mickey. M.D.   ASSISTANT:  Sidra Koyanagi, PA-C (present and scrubbed throughout the case, critical for assistance with exposure, retraction, instrumentation, and closure)   ANESTHESIA: general   ESTIMATED BLOOD LOSS: 50 mL   FLUIDS REPLACED: 700 mL of crystalloid   TOURNIQUET TIME: 85 minutes   DRAINS: 2 medium Hemovac drains   SOFT TISSUE RELEASES: Anterior cruciate ligament, posterior cruciate ligament, deep medial collateral ligament, patellofemoral ligament    IMPLANTS UTILIZED: DePuy Attune size 4 posterior stabilized femoral component (cemented), size 4 rotating platform tibial component (cemented), 35 mm medialized dome patella (cemented), and an 8 mm stabilized rotating platform polyethylene insert.  Treatments: none  Discharge Exam: Blood pressure (!) 108/58, pulse 75, temperature 98.1 F (36.7 C), temperature source Oral, resp. rate 16, height 5' 3 (1.6 m), weight 58.1 kg, SpO2 100%.   Disposition: home   Allergies as of 08/03/2023       Reactions   Sulfa Antibiotics Hives, Rash        Medication List     TAKE these medications    acetaminophen  500 MG tablet Commonly known as: TYLENOL  Take 500 mg by mouth every 6 (six) hours as needed for moderate pain (pain score 4-6) or headache.   amLODipine  5 MG tablet Commonly known as: NORVASC  Take 5 mg by mouth every evening.   aspirin  EC 81 MG tablet Take 1 tablet (81 mg total) by mouth in the morning and at bedtime. Swallow whole. What changed: when to take this   Biotin 5000 MCG Tabs Take 5,000 mcg by mouth daily.   cyanocobalamin  1000 MCG tablet Commonly known as: VITAMIN B12 Take 1,000 mcg by mouth every evening.   ergocalciferol  1.25 MG (50000 UT) capsule Commonly known as: VITAMIN D2 Take 50,000 Units by mouth once a week.   magnesium  gluconate 500 MG tablet Commonly known as: MAGONATE Take 500 mg by mouth every other day.   meclizine  25 MG tablet Commonly known as: ANTIVERT  Take 25 mg by mouth 3 (three) times daily as needed for dizziness.   meloxicam  7.5 MG tablet Commonly known as: MOBIC  Take 1 tablet (7.5 mg  total) by mouth daily as needed for pain.   metroNIDAZOLE  1 % gel Commonly known as: METROGEL  Apply topically daily. Qhs to face for rosacea What changed:  how much to take when to take this reasons to take this   oxyCODONE  5 MG immediate release tablet Commonly known as: Oxy IR/ROXICODONE  Take 0.5-1 tablets (2.5-5 mg total) by mouth every  4 (four) hours as needed for moderate pain (pain score 4-6) (pain score 4-6).   simvastatin  20 MG tablet Commonly known as: ZOCOR  Take 20 mg by mouth every evening.   traMADol  50 MG tablet Commonly known as: ULTRAM  Take 1-2 tablets (50-100 mg total) by mouth every 6 (six) hours as needed for moderate pain (pain score 4-6).   triamcinolone  cream 0.1 % Commonly known as: KENALOG  Apply topically 2 (two) times daily. Apply to bug bites QD-BID PRN. Avoid face, groin, and axilla. What changed:  how much to take when to take this reasons to take this additional instructions               Durable Medical Equipment  (From admission, onward)           Start     Ordered   08/02/23 1231  DME Walker rolling  Once       Question:  Patient needs a walker to treat with the following condition  Answer:  Total knee replacement status   08/02/23 1230   08/02/23 1231  DME Bedside commode  Once       Comments: Patient is not able to walk the distance required to go the bathroom, or he/she is unable to safely negotiate stairs required to access the bathroom.  A 3in1 BSC will alleviate this problem  Question:  Patient needs a bedside commode to treat with the following condition  Answer:  Total knee replacement status   08/02/23 1230            Follow-up Information     Drake Chew, PA-C Follow up on 08/17/2023.   Specialty: Orthopedic Surgery Why: at 1:15pm Contact information: [address] [place] [address] [phone]         Mardee Lynwood SQUIBB, MD Follow up on 09/14/2023.   Specialty: Orthopedic Surgery Why: at 2:45pm Contact information: [address] [facility] [place] [address] [phone]                 Signed: Sidra Drake 08/03/2023, 8:12 AM   Objective: Vital signs in last 24 hours: Temp:  [97.8 F (36.6 C)-98.3 F (36.8 C)] 98.1 F (36.7 C) (01/14 0750) Pulse Rate:  [68-99] 75 (01/14 0750) Resp:   [10-19] 16 (01/14 0750) BP: (104-130)/(47-65) 108/58 (01/14 0750) SpO2:  [89 %-100 %] 100 % (01/14 0750) Weight:  [58.1 kg] 58.1 kg (01/13 1139)  Intake/Output from previous day:  Intake/Output Summary (Last 24 hours) at 08/03/2023 0812 Last data filed at 08/03/2023 0300 Gross per 24 hour  Intake 2286.67 ml  Output 255 ml  Net 2031.67 ml    Intake/Output this shift: No intake/output data recorded.  Labs: No results for input(s): HGB in the last 72 hours. No results for input(s): WBC, RBC, HCT, PLT in the last 72 hours. No results for input(s): NA, K, CL, CO2, BUN, CREATININE, GLUCOSE, CALCIUM  in the last 72 hours. No results for input(s): LABPT, INR in the last 72 hours.  EXAM: General - Patient is Alert, Appropriate, and Oriented Extremity - Neurologically intact ABD soft Neurovascular intact Sensation  intact distally Intact pulses distally Dorsiflexion/Plantar flexion intact No cellulitis present Compartment soft Incision - clean, no drainage Motor Function -  Motor Function - intact, moving foot and toes well on exam.  Patient is neurovascular intact all dermatomes down her left lower extremity.  Posterior tibial pulses appreciated. JP Drain pulled without difficulty. Intact  Assessment/Plan: 1 Day Post-Op Procedure(s) (LRB): COMPUTER ASSISTED TOTAL KNEE ARTHROPLASTY (Left) Procedure(s) (LRB): COMPUTER ASSISTED TOTAL KNEE ARTHROPLASTY (Left) Past Medical History:  Diagnosis Date   Actinic keratosis    Allergic state    Arthritis    Closed fracture of right proximal humerus 07/31/2012   Dysrhythmia    GERD (gastroesophageal reflux disease)    History of shingles    Hyperlipidemia    Hypertension    Prolapse of female pelvic organs    Renal duplication    Seasonal allergies    Spinal headache    after last knee surgery unable to get blood patch b/c of heparin   Vertigo    Principal Problem:   History of total knee arthroplasty,  left  Estimated body mass index is 22.69 kg/m as calculated from the following:   Height as of this encounter: 5' 3 (1.6 m).   Weight as of this encounter: 58.1 kg.  Patient will look to transition to at home PT to work on ROM, gait and strength of her left lower extremity.   Discussed with the patient continuing to utilize Polar Care   Patient will use bone foam in 20-30 minute intervals   Patient will wear TED hose bilaterally to help prevent DVT and clot formation   Discussed the Aquacel bandage.  This bandage will stay in place 7 days postoperatively.  Can be replaced with honeycomb bandages that will be sent home with the patient   Discussed sending the patient home with tramadol  and oxycodone  for as needed pain management.  Patient will continue with her at home meloxicam  to help with swelling and inflammation.  Patient will take an 81 mg aspirin  twice daily for DVT prophylaxis   JP drain removed without difficulty, intact   Weight-Bearing as tolerated to left leg   Patient will follow-up with [facility] clinic orthopedics in 2 weeks for staple removal and reevaluation   Diet - Regular diet Follow up - in 2 weeks Activity - WBAT Disposition - Home Condition Upon Discharge - Good DVT Prophylaxis - Aspirin  and TED hose  Fonda CHARLENA Koyanagi, PA-C Orthopaedic Surgery 08/03/2023, 8:12 AM

## 2023-09-07 ENCOUNTER — Encounter: Payer: Self-pay | Admitting: Dermatology

## 2023-09-07 ENCOUNTER — Ambulatory Visit (INDEPENDENT_AMBULATORY_CARE_PROVIDER_SITE_OTHER): Payer: Medicare HMO | Admitting: Dermatology

## 2023-09-07 DIAGNOSIS — L821 Other seborrheic keratosis: Secondary | ICD-10-CM | POA: Diagnosis not present

## 2023-09-07 DIAGNOSIS — D229 Melanocytic nevi, unspecified: Secondary | ICD-10-CM

## 2023-09-07 DIAGNOSIS — L570 Actinic keratosis: Secondary | ICD-10-CM

## 2023-09-07 DIAGNOSIS — D1801 Hemangioma of skin and subcutaneous tissue: Secondary | ICD-10-CM

## 2023-09-07 DIAGNOSIS — W908XXA Exposure to other nonionizing radiation, initial encounter: Secondary | ICD-10-CM | POA: Diagnosis not present

## 2023-09-07 DIAGNOSIS — Z1283 Encounter for screening for malignant neoplasm of skin: Secondary | ICD-10-CM

## 2023-09-07 DIAGNOSIS — L719 Rosacea, unspecified: Secondary | ICD-10-CM

## 2023-09-07 DIAGNOSIS — L853 Xerosis cutis: Secondary | ICD-10-CM

## 2023-09-07 DIAGNOSIS — L578 Other skin changes due to chronic exposure to nonionizing radiation: Secondary | ICD-10-CM

## 2023-09-07 DIAGNOSIS — Q825 Congenital non-neoplastic nevus: Secondary | ICD-10-CM

## 2023-09-07 DIAGNOSIS — L814 Other melanin hyperpigmentation: Secondary | ICD-10-CM

## 2023-09-07 DIAGNOSIS — L820 Inflamed seborrheic keratosis: Secondary | ICD-10-CM | POA: Diagnosis not present

## 2023-09-07 DIAGNOSIS — Z872 Personal history of diseases of the skin and subcutaneous tissue: Secondary | ICD-10-CM

## 2023-09-07 MED ORDER — METRONIDAZOLE 0.75 % EX CREA: TOPICAL_CREAM | Freq: Two times a day (BID) | CUTANEOUS | 5 refills | Status: AC

## 2023-09-07 NOTE — Patient Instructions (Addendum)
 Cryotherapy Aftercare  Wash gently with soap and water everyday.   Apply Vaseline and Band-Aid daily until healed.   Recommend starting moisturizer with exfoliant (Urea, Salicylic acid, or Lactic acid) one to two times daily to help smooth rough and bumpy skin.  OTC options include Cetaphil Rough and Bumpy lotion (Urea), Eucerin Roughness Relief lotion or spot treatment cream (Urea), CeraVe SA lotion/cream for Rough and Bumpy skin (Sal Acid), Gold Bond Rough and Bumpy cream (Sal Acid), and AmLactin 12% lotion/cream (Lactic Acid).  If applying in morning, also apply sunscreen to sun-exposed areas, since these exfoliating moisturizers can increase sensitivity to sun.   Gentle Skin Care Guide  1. Bathe no more than once a day.  2. Avoid bathing in hot water  3. Use a mild soap like Dove, Vanicream, Cetaphil, CeraVe. Can use Lever 2000 or Cetaphil antibacterial soap  4. Use soap only where you need it. On most days, use it under your arms, between your legs, and on your feet. Let the water rinse other areas unless visibly dirty.  5. When you get out of the bath/shower, use a towel to gently blot your skin dry, don't rub it.  6. While your skin is still a little damp, apply a moisturizing cream such as Vanicream, CeraVe, Cetaphil, Eucerin, Sarna lotion or plain Vaseline Jelly. For hands apply Neutrogena [place] Hand Cream or Excipial Hand Cream.  7. Reapply moisturizer any time you start to itch or feel dry.  8. Sometimes using free and clear laundry detergents can be helpful. Fabric softener sheets should be avoided. Downy Free & Gentle liquid, or any liquid fabric softener that is free of dyes and perfumes, it acceptable to use  9. If your doctor has given you prescription creams you may apply moisturizers over them     Melanoma ABCDEs  Melanoma is the most dangerous type of skin cancer, and is the leading cause of death from skin disease.  You are more likely to develop melanoma if  you: Have light-colored skin, light-colored eyes, or red or blond hair Spend a lot of time in the sun Tan regularly, either outdoors or in a tanning bed Have had blistering sunburns, especially during childhood Have a close family member who has had a melanoma Have atypical moles or large birthmarks  Early detection of melanoma is key since treatment is typically straightforward and cure rates are extremely high if we catch it early.   The first sign of melanoma is often a change in a mole or a new dark spot.  The ABCDE system is a way of remembering the signs of melanoma.  A for asymmetry:  The two halves do not match. B for border:  The edges of the growth are irregular. C for color:  A mixture of colors are present instead of an even brown color. D for diameter:  Melanomas are usually (but not always) greater than 6mm - the size of a pencil eraser. E for evolution:  The spot keeps changing in size, shape, and color.  Please check your skin once per month between visits. You can use a small mirror in front and a large mirror behind you to keep an eye on the back side or your body.   If you see any new or changing lesions before your next follow-up, please call to schedule a visit.  Please continue daily skin protection including broad spectrum sunscreen SPF 30+ to sun-exposed areas, reapplying every 2 hours as needed when you're outdoors.  Due to recent changes in healthcare laws, you may see results of your pathology and/or laboratory studies on MyChart before the doctors have had a chance to review them. We understand that in some cases there may be results that are confusing or concerning to you. Please understand that not all results are received at the same time and often the doctors may need to interpret multiple results in order to provide you with the best plan of care or course of treatment. Therefore, we ask that you please give [place] 2 business days to thoroughly review all your  results before contacting the office for clarification. Should we see a critical lab result, you will be contacted sooner.   If You Need Anything After Your Visit  If you have any questions or concerns for your doctor, please call our main line at [phone] and press option 4 to reach your doctor's medical assistant. If no one answers, please leave a voicemail as directed and we will return your call as soon as possible. Messages left after 4 pm will be answered the following business day.   You may also send [place] a message via MyChart. We typically respond to MyChart messages within 1-2 business days.  For prescription refills, please ask your pharmacy to contact our office. Our fax number is [phone].  If you have an urgent issue when the clinic is closed that cannot wait until the next business day, you can page your doctor at the number below.    Please note that while we do our best to be available for urgent issues outside of office hours, we are not available 24/7.   If you have an urgent issue and are unable to reach [place], you may choose to seek medical care at your doctor's office, retail clinic, urgent care center, or emergency room.  If you have a medical emergency, please immediately call 911 or go to the emergency department.  Pager Numbers  - Dr. Gwen Pounds: [phone]  - Dr. Roseanne Reno: [phone]  - Dr. Katrinka Blazing: [phone]   In the event of inclement weather, please call our main line at [phone] for an update on the status of any delays or closures.  Dermatology Medication Tips: Please keep the boxes that topical medications come in in order to help keep track of the instructions about where and how to use these. Pharmacies typically print the medication instructions only on the boxes and not directly on the medication tubes.   If your medication is too expensive, please contact our office at [phone] option 4 or send [place] a message through MyChart.   We are  unable to tell what your co-pay for medications will be in advance as this is different depending on your insurance coverage. However, we may be able to find a substitute medication at lower cost or fill out paperwork to get insurance to cover a needed medication.   If a prior authorization is required to get your medication covered by your insurance company, please allow [place] 1-2 business days to complete this process.  Drug prices often vary depending on where the prescription is filled and some pharmacies may offer cheaper prices.  The website www.goodrx.com contains coupons for medications through different pharmacies. The prices here do not account for what the cost may be with help from insurance (it may be cheaper with your insurance), but the website can give you the price if you did not use any insurance.  - You can print the associated coupon and take  it with your prescription to the pharmacy.  - You may also stop by our office during regular business hours and pick up a GoodRx coupon card.  - If you need your prescription sent electronically to a different pharmacy, notify our office through [facility] or by phone at [phone] option 4.     Si Usted Necesita Algo Despus de Su Visita  Tambin puede enviarnos un mensaje a travs de Clinical cytogeneticist. Por lo general respondemos a los mensajes de MyChart en el transcurso de 1 a 2 das hbiles.  Para renovar recetas, por favor pida a su farmacia que se ponga en contacto con nuestra oficina. Annie Sable de fax es [place] [phone].  Si tiene un asunto urgente cuando la clnica est cerrada y que no puede esperar hasta el siguiente da hbil, puede llamar/localizar a su doctor(a) al nmero que aparece a continuacin.   Por favor, tenga en cuenta que aunque hacemos todo lo posible para estar disponibles para asuntos urgentes fuera del horario de [place], no estamos disponibles las 24 horas del da, los 7 [address]  [address] de la [place].   Si tiene un  problema urgente y no puede comunicarse con nosotros, puede optar por buscar atencin mdica  en el consultorio de su doctor(a), en una clnica privada, en un centro de atencin urgente o en una sala de emergencias.  Si tiene Engineer, drilling, por favor llame inmediatamente al 911 o vaya a la sala de emergencias.  Nmeros de bper  - Dr. Gwen Pounds: [phone]  - Dra. Roseanne Reno: [phone]  - Dr. Katrinka Blazing: [phone]   En caso de inclemencias del tiempo, por favor llame a Lacy Duverney principal al [phone] para una actualizacin sobre el [place] de cualquier retraso o cierre.  Consejos para la medicacin en dermatologa: Por favor, guarde las cajas en las que vienen los medicamentos de uso tpico para ayudarle a seguir las instrucciones sobre dnde y cmo usarlos. Las farmacias generalmente imprimen las instrucciones del medicamento slo en las cajas y no directamente en los tubos del [place].   Si su medicamento es muy caro, por favor, pngase en contacto con Rolm Gala llamando al [phone] y presione la opcin 4 o envenos un mensaje a travs de Clinical cytogeneticist.   No podemos decirle cul ser su copago por los medicamentos por adelantado ya que esto es diferente dependiendo de la cobertura de su seguro. Sin embargo, es posible que podamos encontrar un medicamento sustituto a Audiological scientist un formulario para que el seguro cubra el medicamento que se considera necesario.   Si se requiere una autorizacin previa para que su compaa de seguros [place] su medicamento, por favor permtanos de 1 a 2 das hbiles para completar [address]  Los precios de los medicamentos varan con frecuencia dependiendo del Environmental consultant de dnde se surte la receta y alguna farmacias pueden ofrecer precios ms baratos.  El sitio web www.goodrx.com tiene cupones para medicamentos de Health and safety inspector. Los precios aqu no tienen en cuenta lo que podra costar con la ayuda del seguro (puede ser ms  barato con su seguro), pero el sitio web puede darle el precio si no utiliz Tourist information centre manager.  - Puede imprimir el cupn correspondiente y llevarlo con su receta a la farmacia.  - Tambin puede pasar por nuestra oficina durante el horario de atencin regular y Education officer, museum una tarjeta de cupones de GoodRx.  - Si necesita que su receta se enve electrnicamente a Psychiatrist, informe a nuestra oficina a travs de MyChart  de [facility] o por telfono llamando al [phone] y presione la opcin 4.

## 2023-09-07 NOTE — Progress Notes (Addendum)
 Follow-Up Visit   Subjective  Terri [name] is a 83 y.o. female who presents for the following: Skin Cancer Screening and Full Body Skin Exam  The patient presents for Total-Body Skin Exam (TBSE) for skin cancer screening and mole check. The patient has spots, moles and lesions to be evaluated, some may be new or changing and the patient may have concern these could be cancer.  Patient with hx of AK's and rosacea. Patient has a spot at right cheek and right neck which is itchy and irritated. Some redness at nose. Patient does use metronidazole 1% cr 1-2 times daily as needed for rosacea.   The following portions of the chart were reviewed this encounter and updated as appropriate: medications, allergies, medical history  Review of Systems:  No other skin or systemic complaints except as noted in HPI or Assessment and Plan.  Objective  Well appearing patient in no apparent distress; mood and affect are within normal limits.  A full examination was performed including scalp, head, eyes, ears, nose, lips, neck, chest, axillae, abdomen, back, buttocks, bilateral upper extremities, bilateral lower extremities, hands, feet, fingers, toes, fingernails, and toenails. All findings within normal limits unless otherwise noted below.   Relevant physical exam findings are noted in the Assessment and Plan.  R lower cheek x 4, R nasal dorsum x 1, L malar cheek x 1, R forearm x 1, L hand dorsum x 1 (8) Erythematous thin papules/macules with gritty scale.  R neck x 2 (2) Erythematous stuck-on, waxy papule  Assessment & Plan   SKIN CANCER SCREENING PERFORMED TODAY.  ACTINIC DAMAGE - Chronic condition, secondary to cumulative UV/sun exposure - diffuse scaly erythematous macules with underlying dyspigmentation - Recommend daily broad spectrum sunscreen SPF 30+ to sun-exposed areas, reapply every 2 hours as needed.  - Staying in the shade or wearing long sleeves, sun glasses (UVA+UVB protection)  and wide brim hats (4-inch brim around the entire circumference of the hat) are also recommended for sun protection.  - Call for new or changing lesions.  LENTIGINES, SEBORRHEIC KERATOSES, HEMANGIOMAS - Benign normal skin lesions - Benign-appearing - Call for any changes  MELANOCYTIC NEVI - Tan-brown and/or pink-flesh-colored symmetric macules and papules - Benign appearing on exam today - Observation - Call clinic for new or changing moles - Recommend daily use of broad spectrum spf 30+ sunscreen to sun-exposed areas.   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  Observation.  Call clinic for new or changing moles.  Recommend daily use of broad spectrum spf 30+ sunscreen to sun-exposed areas.    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 Condition is symptomatic / bothersome to patient. Not to goal.   Rosacea is a chronic progressive skin condition usually affecting the face of adults, causing redness and/or acne bumps. It is treatable but not curable. It sometimes affects the eyes (ocular rosacea) as well. It may respond to topical and/or systemic medication and can flare with stress, sun exposure, alcohol, exercise, topical steroids (including hydrocortisone/cortisone 10) and some foods.  Daily application of broad spectrum spf 30+ sunscreen to face is recommended to reduce flares.   Treatment Plan Continue topical metronidazole 1-2 times daily- will change to 0.75% cream since pt has dry skin. Better results if used daily and twice daily with flares  Xerosis - diffuse xerotic patches - recommend gentle, hydrating  skin care - gentle skin care handout given  ROSACEA   Related Medications metroNIDAZOLE (METROGEL) 1 % gel Apply topically daily. Qhs to face for rosacea metroNIDAZOLE (METROCREAM) 0.75 %  cream Apply topically 2 (two) times daily. AK (ACTINIC KERATOSIS) (8) R lower cheek x 4, R nasal dorsum x 1, L malar cheek x 1, R forearm x 1, L hand dorsum x 1 (8) Actinic keratoses are precancerous spots that appear secondary to cumulative UV radiation exposure/sun exposure over time. They are chronic with expected duration over 1 year. A portion of actinic keratoses will progress to squamous cell carcinoma of the skin. It is not possible to reliably predict which spots will progress to skin cancer and so treatment is recommended to prevent development of skin cancer.  Recommend daily broad spectrum sunscreen SPF 30+ to sun-exposed areas, reapply every 2 hours as needed.  Recommend staying in the shade or wearing long sleeves, sun glasses (UVA+UVB protection) and wide brim hats (4-inch brim around the entire circumference of the hat). Call for new or changing lesions. Destruction of lesion - R lower cheek x 4, R nasal dorsum x 1, L malar cheek x 1, R forearm x 1, L hand dorsum x 1 (8)  Destruction method: cryotherapy   Informed consent: discussed and consent obtained   Lesion destroyed using liquid nitrogen: Yes   Region frozen until ice ball extended beyond lesion: Yes   Outcome: patient tolerated procedure well with no complications   Post-procedure details: wound care instructions given   Additional details:  Prior to procedure, discussed risks of blister formation, small wound, skin dyspigmentation, or rare scar following cryotherapy. Recommend Vaseline ointment to treated areas while healing.  INFLAMED SEBORRHEIC KERATOSIS (2) R neck x 2 (2) Symptomatic, irritating, patient would like treated.  Benign-appearing.  Call clinic for new or changing lesions.   Destruction of lesion - R neck x 2 (2)  Destruction method: cryotherapy   Informed consent: discussed and consent obtained   Lesion destroyed using liquid nitrogen: Yes   Region frozen until ice ball extended beyond lesion: Yes    Outcome: patient tolerated procedure well with no complications   Post-procedure details: wound care instructions given   Additional details:  Prior to procedure, discussed risks of blister formation, small wound, skin dyspigmentation, or rare scar following cryotherapy. Recommend Vaseline ointment to treated areas while healing.  Return in about 1 year (around 09/06/2024) for TBSE, with Dr. Roseanne Reno, Hx AK, Rosacea.  Anise Salvo, RMA, am acting as scribe for Willeen Niece, MD .   Documentation: I have reviewed the above documentation for accuracy and completeness, and I agree with the above.  Willeen Niece, MD

## 2024-03-27 ENCOUNTER — Other Ambulatory Visit: Payer: Self-pay | Admitting: Obstetrics and Gynecology

## 2024-03-27 DIAGNOSIS — Z1231 Encounter for screening mammogram for malignant neoplasm of breast: Secondary | ICD-10-CM

## 2024-04-20 ENCOUNTER — Ambulatory Visit
Admission: RE | Admit: 2024-04-20 | Discharge: 2024-04-20 | Disposition: A | Discharge status: Home / Self Care | Source: Ambulatory Visit | Attending: Obstetrics and Gynecology | Admitting: Obstetrics and Gynecology

## 2024-04-20 DIAGNOSIS — Z1231 Encounter for screening mammogram for malignant neoplasm of breast: Secondary | ICD-10-CM | POA: Insufficient documentation

## 2024-09-25 ENCOUNTER — Ambulatory Visit: Payer: Medicare HMO | Admitting: Dermatology
